# Patient Record
Sex: Female | Born: 1960 | Race: Black or African American | Hispanic: No | Smoking: Never smoker
Health system: Southern US, Community
[De-identification: ages and names within clinical notes are randomized; demographics above are authoritative.]

## PROBLEM LIST (undated history)

## (undated) DIAGNOSIS — K219 Gastro-esophageal reflux disease without esophagitis: Secondary | ICD-10-CM

## (undated) DIAGNOSIS — F32A Depression, unspecified: Secondary | ICD-10-CM

## (undated) DIAGNOSIS — F909 Attention-deficit hyperactivity disorder, unspecified type: Secondary | ICD-10-CM

## (undated) DIAGNOSIS — D649 Anemia, unspecified: Secondary | ICD-10-CM

## (undated) DIAGNOSIS — R519 Headache, unspecified: Secondary | ICD-10-CM

## (undated) DIAGNOSIS — I10 Essential (primary) hypertension: Secondary | ICD-10-CM

## (undated) HISTORY — PX: ABDOMINAL HYSTERECTOMY: SHX81

## (undated) HISTORY — DX: Attention-deficit hyperactivity disorder, unspecified type: F90.9

## (undated) HISTORY — DX: Depression, unspecified: F32.A

## (undated) HISTORY — DX: Headache, unspecified: R51.9

---

## 2004-04-05 ENCOUNTER — Other Ambulatory Visit: Payer: Self-pay

## 2006-01-29 ENCOUNTER — Emergency Department: Payer: Self-pay | Admitting: Emergency Medicine

## 2006-01-29 ENCOUNTER — Other Ambulatory Visit: Payer: Self-pay

## 2006-09-16 ENCOUNTER — Emergency Department: Payer: Self-pay | Admitting: Emergency Medicine

## 2006-09-24 ENCOUNTER — Ambulatory Visit: Payer: Self-pay

## 2006-10-27 ENCOUNTER — Other Ambulatory Visit: Payer: Self-pay

## 2006-10-27 ENCOUNTER — Ambulatory Visit: Payer: Self-pay | Admitting: Obstetrics and Gynecology

## 2006-11-23 ENCOUNTER — Ambulatory Visit: Payer: Self-pay | Admitting: Obstetrics and Gynecology

## 2007-12-16 ENCOUNTER — Ambulatory Visit: Payer: Self-pay | Admitting: Family Medicine

## 2008-02-09 ENCOUNTER — Ambulatory Visit: Payer: Self-pay | Admitting: Unknown Physician Specialty

## 2008-02-23 ENCOUNTER — Ambulatory Visit: Payer: Self-pay | Admitting: Family Medicine

## 2008-03-02 ENCOUNTER — Ambulatory Visit: Payer: Self-pay | Admitting: Unknown Physician Specialty

## 2008-10-29 ENCOUNTER — Emergency Department: Payer: Self-pay | Admitting: Emergency Medicine

## 2008-12-19 ENCOUNTER — Ambulatory Visit: Payer: Self-pay | Admitting: Internal Medicine

## 2009-05-19 ENCOUNTER — Emergency Department: Payer: Self-pay | Admitting: Emergency Medicine

## 2009-06-14 ENCOUNTER — Ambulatory Visit: Payer: Self-pay | Admitting: Surgery

## 2009-07-12 ENCOUNTER — Inpatient Hospital Stay: Payer: Self-pay | Admitting: General Surgery

## 2009-12-24 ENCOUNTER — Ambulatory Visit: Payer: Self-pay | Admitting: Internal Medicine

## 2011-01-01 ENCOUNTER — Ambulatory Visit: Payer: Self-pay | Admitting: Internal Medicine

## 2011-02-25 ENCOUNTER — Emergency Department: Payer: Self-pay | Admitting: Emergency Medicine

## 2012-01-05 ENCOUNTER — Ambulatory Visit: Payer: Self-pay | Admitting: Internal Medicine

## 2013-03-28 ENCOUNTER — Ambulatory Visit: Payer: Self-pay | Admitting: Unknown Physician Specialty

## 2013-05-06 ENCOUNTER — Ambulatory Visit: Payer: Self-pay | Admitting: Unknown Physician Specialty

## 2014-02-07 ENCOUNTER — Ambulatory Visit: Payer: Self-pay | Admitting: Internal Medicine

## 2015-01-26 ENCOUNTER — Other Ambulatory Visit: Payer: Self-pay | Admitting: Internal Medicine

## 2015-01-26 DIAGNOSIS — Z1231 Encounter for screening mammogram for malignant neoplasm of breast: Secondary | ICD-10-CM

## 2015-02-09 ENCOUNTER — Ambulatory Visit: Payer: Self-pay

## 2015-02-21 ENCOUNTER — Ambulatory Visit: Payer: Self-pay

## 2015-02-22 ENCOUNTER — Ambulatory Visit
Admission: RE | Admit: 2015-02-22 | Discharge: 2015-02-22 | Disposition: A | Discharge status: Home / Self Care | Payer: Medicare Other | Source: Ambulatory Visit | Attending: Internal Medicine | Admitting: Internal Medicine

## 2015-02-22 DIAGNOSIS — Z1231 Encounter for screening mammogram for malignant neoplasm of breast: Secondary | ICD-10-CM | POA: Diagnosis not present

## 2015-09-11 ENCOUNTER — Emergency Department: Payer: Medicare Other

## 2015-09-11 ENCOUNTER — Emergency Department
Admission: EM | Admit: 2015-09-11 | Discharge: 2015-09-11 | Disposition: A | Discharge status: Home / Self Care | Payer: Medicare Other | Attending: Emergency Medicine | Admitting: Emergency Medicine

## 2015-09-11 ENCOUNTER — Encounter: Payer: Self-pay | Admitting: *Deleted

## 2015-09-11 DIAGNOSIS — Y9289 Other specified places as the place of occurrence of the external cause: Secondary | ICD-10-CM | POA: Insufficient documentation

## 2015-09-11 DIAGNOSIS — S93402A Sprain of unspecified ligament of left ankle, initial encounter: Secondary | ICD-10-CM | POA: Insufficient documentation

## 2015-09-11 DIAGNOSIS — Y9301 Activity, walking, marching and hiking: Secondary | ICD-10-CM | POA: Insufficient documentation

## 2015-09-11 DIAGNOSIS — S99912A Unspecified injury of left ankle, initial encounter: Secondary | ICD-10-CM | POA: Diagnosis present

## 2015-09-11 DIAGNOSIS — Y998 Other external cause status: Secondary | ICD-10-CM | POA: Diagnosis not present

## 2015-09-11 DIAGNOSIS — W1843XA Slipping, tripping and stumbling without falling due to stepping from one level to another, initial encounter: Secondary | ICD-10-CM | POA: Insufficient documentation

## 2015-09-11 DIAGNOSIS — W19XXXA Unspecified fall, initial encounter: Secondary | ICD-10-CM

## 2015-09-11 DIAGNOSIS — M79605 Pain in left leg: Secondary | ICD-10-CM

## 2015-09-11 MED ORDER — NAPROXEN 500 MG PO TABS
500.0000 mg | ORAL_TABLET | Freq: Two times a day (BID) | ORAL | Status: DC
Start: 2015-09-11 — End: 2018-10-13

## 2015-09-11 NOTE — ED Provider Notes (Signed)
CSN: [id number]     Arrival date & time 09/11/15  1545 History   First MD Initiated Contact with Patient 09/11/15 1644     Chief Complaint  Patient presents with  . Ankle Pain     (Consider location/radiation/quality/duration/timing/severity/associated sxs/prior Treatment) HPI  54 year old female presents to emergency department for evaluation of left lower leg pain. Patient has 10 out of 10 pain that is sharp and located over the left distal fibula and proximal fibula. She states she was walking around 3:30 PM, stepped onto an uneven surface and rolled her left ankle. Patient is able to ambulate but with a significant limp. She's not had any medications for pain. She denies any knee or hip pain. No other injuries to her body.  History reviewed. No pertinent past medical history. History reviewed. No pertinent past surgical history. No family history on file. Social History  Substance Use Topics  . Smoking status: Never Smoker   . Smokeless tobacco: None  . Alcohol Use: No   OB History    No data available     Review of Systems  Constitutional: Negative for fever, chills, activity change and fatigue.  HENT: Negative for congestion, sinus pressure and sore throat.   Eyes: Negative for visual disturbance.  Respiratory: Negative for cough, chest tightness and shortness of breath.   Cardiovascular: Negative for chest pain and leg swelling.  Gastrointestinal: Negative for nausea, vomiting, abdominal pain and diarrhea.  Genitourinary: Negative for dysuria.  Musculoskeletal: Positive for joint swelling, arthralgias and gait problem.  Skin: Negative for rash.  Neurological: Negative for weakness, numbness and headaches.  Hematological: Negative for adenopathy.  Psychiatric/Behavioral: Negative for behavioral problems, confusion and agitation.      Allergies  Review of patient's allergies indicates no known allergies.  Home Medications   Prior to Admission medications    Medication Sig Start Date End Date Taking? Authorizing Provider  naproxen (NAPROSYN) 500 MG tablet Take 1 tablet (500 mg total) by mouth 2 (two) times daily with a meal. 09/11/15   Evon Slackhomas C Gaines, PA-C   BP 121/79 mmHg  Pulse 78  Temp(Src) 98.4 F (36.9 C) (Oral)  Resp 18  Ht 4\' 8"  (1.422 m)  Wt 74.844 kg  BMI 37.01 kg/m2  SpO2 100% Physical Exam  Constitutional: She is oriented to person, place, and time. She appears well-developed and well-nourished. No distress.  HENT:  Head: Normocephalic and atraumatic.  Mouth/Throat: Oropharynx is clear and moist.  Eyes: EOM are normal. Pupils are equal, round, and reactive to light. Right eye exhibits no discharge. Left eye exhibits no discharge.  Neck: Normal range of motion. Neck supple.  Cardiovascular: Normal rate and intact distal pulses.   Pulmonary/Chest: Effort normal. No respiratory distress.  Musculoskeletal:  Emanation of left lower extremity shows patient has full range of motion of the hip knee and ankle. There is tenderness palpation along the mid fibular shaft as well as the distal fibula. Mild lateral soft tissue swelling along the lateral ankle. There is no tenderness along the base of fifth metatarsal. She has good ankle plantarflexion dorsiflexion. She is able to bear weight but with a moderate limp. No skin breakdown noted.  Neurological: She is alert and oriented to person, place, and time. She has normal reflexes.  Skin: Skin is warm and dry.  Psychiatric: She has a normal mood and affect. Her behavior is normal. Thought content normal.    ED Course  Procedures (including critical care time) SPLINT APPLICATION Date/Time: 5:50 PM Authorized  by: Amador Cunas CHRISTOPHER Consent: Verbal consent obtained. Risks and benefits: risks, benefits and alternatives were discussed Consent given by: patient Splint applied by: ED technician Location details: Left ankle  Splint type: Prefab ankle stirrup  Supplies used: Ace  wrap ankle stirrup splint  Post-procedure: The splinted body part was neurovascularly unchanged following the procedure. Patient tolerance: Patient tolerated the procedure well with no immediate complications.       Labs Review Labs Reviewed - No data to display  Imaging Review Dg Tibia/fibula Left  09/11/2015  CLINICAL DATA:  Recent twisting injury with lower leg pain, initial encounter EXAM: LEFT TIBIA AND FIBULA - 2 VIEW COMPARISON:  None. FINDINGS: There is no evidence of fracture or other focal bone lesions. Soft tissues are unremarkable. IMPRESSION: No acute abnormality noted. Electronically Signed   By: Alcide Clever M.D.   On: 09/11/2015 17:24   I have personally reviewed and evaluated these images and lab results as part of my medical decision-making.   EKG Interpretation None      MDM   Final diagnoses:  Fall  Leg pain, lateral, left  Ankle sprain, left, initial encounter    54 year old female with left ankle sprain after fall just prior to arrival today. X-rays show no evidence of acute bony abnormality. She is placed into a ankle stirrup splint. Given crutches to help with weightbearing. She will rest ice elevate over the next few days. Naproxen 500 mg 1 tab by mouth twice a day as needed for pain and inflammation. She is weightbearing as tolerated. Follow-up with orthopedics if no improvement in 5-7 days.    Evon Slack, PA-C 09/11/15 1752  Jennye Moccasin, MD 09/11/15 [phone]

## 2015-09-11 NOTE — Discharge Instructions (Signed)
Acute Ankle Sprain With Phase I Rehab An acute ankle sprain is a partial or complete tear in one or more of the ligaments of the ankle due to traumatic injury. The severity of the injury depends on both the number of ligaments sprained and the grade of sprain. There are 3 grades of sprains.   A grade 1 sprain is a mild sprain. There is a slight pull without obvious tearing. There is no loss of strength, and the muscle and ligament are the correct length.  A grade 2 sprain is a moderate sprain. There is tearing of fibers within the substance of the ligament where it connects two bones or two cartilages. The length of the ligament is increased, and there is usually decreased strength.  A grade 3 sprain is a complete rupture of the ligament and is uncommon. In addition to the grade of sprain, there are three types of ankle sprains.  Lateral ankle sprains: This is a sprain of one or more of the three ligaments on the outer side (lateral) of the ankle. These are the most common sprains. Medial ankle sprains: There is one large triangular ligament of the inner side (medial) of the ankle that is susceptible to injury. Medial ankle sprains are less common. Syndesmosis, "high ankle," sprains: The syndesmosis is the ligament that connects the two bones of the lower leg. Syndesmosis sprains usually only occur with very severe ankle sprains. SYMPTOMS  Pain, tenderness, and swelling in the ankle, starting at the side of injury that may progress to the whole ankle and foot with time.  "Pop" or tearing sensation at the time of injury.  Bruising that may spread to the heel.  Impaired ability to walk soon after injury. CAUSES   Acute ankle sprains are caused by trauma placed on the ankle that temporarily forces or pries the anklebone (talus) out of its normal socket.  Stretching or tearing of the ligaments that normally hold the joint in place (usually due to a twisting injury). RISK INCREASES  WITH:  Previous ankle sprain.  Sports in which the foot may land awkwardly (i.e., basketball, volleyball, or soccer) or walking or running on uneven or rough surfaces.  Shoes with inadequate support to prevent sideways motion when stress occurs.  Poor strength and flexibility.  Poor balance skills.  Contact sports. PREVENTION   Warm up and stretch properly before activity.  Maintain physical fitness:  Ankle and leg flexibility, muscle strength, and endurance.  Cardiovascular fitness.  Balance training activities.  Use proper technique and have a coach correct improper technique.  Taping, protective strapping, bracing, or high-top tennis shoes may help prevent injury. Initially, tape is best; however, it loses most of its support function within 10 to 15 minutes.  Wear proper-fitted protective shoes (High-top shoes with taping or bracing is more effective than either alone).  Provide the ankle with support during sports and practice activities for 12 months following injury. PROGNOSIS   If treated properly, ankle sprains can be expected to recover completely; however, the length of recovery depends on the degree of injury.  A grade 1 sprain usually heals enough in 5 to 7 days to allow modified activity and requires an average of 6 weeks to heal completely.  A grade 2 sprain requires 6 to 10 weeks to heal completely.  A grade 3 sprain requires 12 to 16 weeks to heal.  A syndesmosis sprain often takes more than 3 months to heal. RELATED COMPLICATIONS   Frequent recurrence of symptoms may  result in a chronic problem. Appropriately addressing the problem the first time decreases the frequency of recurrence and optimizes healing time. Severity of the initial sprain does not predict the likelihood of later instability. °· Injury to other structures (bone, cartilage, or tendon). °· A chronically unstable or arthritic ankle joint is a possibility with repeated  sprains. °TREATMENT °Treatment initially involves the use of ice, medication, and compression bandages to help reduce pain and inflammation. Ankle sprains are usually immobilized in a walking cast or boot to allow for healing. Crutches may be recommended to reduce pressure on the injury. After immobilization, strengthening and stretching exercises may be necessary to regain strength and a full range of motion. Surgery is rarely needed to treat ankle sprains. °MEDICATION  °· Nonsteroidal anti-inflammatory medications, such as aspirin and ibuprofen (do not take for the first 3 days after injury or within 7 days before surgery), or other minor pain relievers, such as acetaminophen, are often recommended. Take these as directed by your caregiver. Contact your caregiver immediately if any bleeding, stomach upset, or signs of an allergic reaction occur from these medications. °· Ointments applied to the skin may be helpful. °· Pain relievers may be prescribed as necessary by your caregiver. Do not take prescription pain medication for longer than 4 to 7 days. Use only as directed and only as much as you need. °HEAT AND COLD °· Cold treatment (icing) is used to relieve pain and reduce inflammation for acute and chronic cases. Cold should be applied for 10 to 15 minutes every 2 to 3 hours for inflammation and pain and immediately after any activity that aggravates your symptoms. Use ice packs or an ice massage. °· Heat treatment may be used before performing stretching and strengthening activities prescribed by your caregiver. Use a heat pack or a warm soak. °SEEK IMMEDIATE MEDICAL CARE IF:  °· Pain, swelling, or bruising worsens despite treatment. °· You experience pain, numbness, discoloration, or coldness in the foot or toes. °· New, unexplained symptoms develop (drugs used in treatment may produce side effects.) °EXERCISES  °PHASE I EXERCISES °RANGE OF MOTION (ROM) AND STRETCHING EXERCISES - Ankle Sprain, Acute Phase I,  Weeks 1 to 2 °These exercises may help you when beginning to restore flexibility in your ankle. You will likely work on these exercises for the 1 to 2 weeks after your injury. Once your physician, physical therapist, or athletic trainer sees adequate progress, he or she will advance your exercises. While completing these exercises, remember:  °· Restoring tissue flexibility helps normal motion to return to the joints. This allows healthier, less painful movement and activity. °· An effective stretch should be held for at least 30 seconds. °· A stretch should never be painful. You should only feel a gentle lengthening or release in the stretched tissue. °RANGE OF MOTION - Dorsi/Plantar Flexion °· While sitting with your right / left knee straight, draw the top of your foot upwards by flexing your ankle. Then reverse the motion, pointing your toes downward. °· Hold each position for __________ seconds. °· After completing your first set of exercises, repeat this exercise with your knee bent. °Repeat __________ times. Complete this exercise __________ times per day.  °RANGE OF MOTION - Ankle Alphabet °· Imagine your right / left big toe is a pen. °· Keeping your hip and knee still, write out the entire alphabet with your "pen." Make the letters as large as you can without increasing any discomfort. °Repeat __________ times. Complete this exercise __________   times per day.  °STRENGTHENING EXERCISES - Ankle Sprain, Acute -Phase I, Weeks 1 to 2 °These exercises may help you when beginning to restore strength in your ankle. You will likely work on these exercises for 1 to 2 weeks after your injury. Once your physician, physical therapist, or athletic trainer sees adequate progress, he or she will advance your exercises. While completing these exercises, remember:  °· Muscles can gain both the endurance and the strength needed for everyday activities through controlled exercises. °· Complete these exercises as instructed by  your physician, physical therapist, or athletic trainer. Progress the resistance and repetitions only as guided. °· You may experience muscle soreness or fatigue, but the pain or discomfort you are trying to eliminate should never worsen during these exercises. If this pain does worsen, stop and make certain you are following the directions exactly. If the pain is still present after adjustments, discontinue the exercise until you can discuss the trouble with your clinician. °STRENGTH - Dorsiflexors °· Secure a rubber exercise band/tubing to a fixed object (i.e., table, pole) and loop the other end around your right / left foot. °· Sit on the floor facing the fixed object. The band/tubing should be slightly tense when your foot is relaxed. °· Slowly draw your foot back toward you using your ankle and toes. °· Hold this position for __________ seconds. Slowly release the tension in the band and return your foot to the starting position. °Repeat __________ times. Complete this exercise __________ times per day.  °STRENGTH - Plantar-flexors  °· Sit with your right / left leg extended. Holding onto both ends of a rubber exercise band/tubing, loop it around the ball of your foot. Keep a slight tension in the band. °· Slowly push your toes away from you, pointing them downward. °· Hold this position for __________ seconds. Return slowly, controlling the tension in the band/tubing. °Repeat __________ times. Complete this exercise __________ times per day.  °STRENGTH - Ankle Eversion °· Secure one end of a rubber exercise band/tubing to a fixed object (table, pole). Loop the other end around your foot just before your toes. °· Place your fists between your knees. This will focus your strengthening at your ankle. °· Drawing the band/tubing across your opposite foot, slowly, pull your little toe out and up. Make sure the band/tubing is positioned to resist the entire motion. °· Hold this position for __________ seconds. °Have  your muscles resist the band/tubing as it slowly pulls your foot back to the starting position.  °Repeat __________ times. Complete this exercise __________ times per day.  °STRENGTH - Ankle Inversion °· Secure one end of a rubber exercise band/tubing to a fixed object (table, pole). Loop the other end around your foot just before your toes. °· Place your fists between your knees. This will focus your strengthening at your ankle. °· Slowly, pull your big toe up and in, making sure the band/tubing is positioned to resist the entire motion. °· Hold this position for __________ seconds. °· Have your muscles resist the band/tubing as it slowly pulls your foot back to the starting position. °Repeat __________ times. Complete this exercises __________ times per day.  °STRENGTH - Towel Curls °· Sit in a chair positioned on a non-carpeted surface. °· Place your right / left foot on a towel, keeping your heel on the floor. °· Pull the towel toward your heel by only curling your toes. Keep your heel on the floor. °· If instructed by your physician, physical therapist,   or athletic trainer, add weight to the end of the towel. Repeat __________ times. Complete this exercise __________ times per day.   This information is not intended to replace advice given to you by your health care provider. Make sure you discuss any questions you have with your health care provider.   Document Released: 04/02/2005 Document Revised: 09/22/2014 Document Reviewed: 12/14/2008 [employer] Interactive Patient Education 2016 [employer]  Acute Ankle Sprain With Phase I Rehab An acute ankle sprain is a partial or complete tear in one or more of the ligaments of the ankle due to traumatic injury. The severity of the injury depends on both the number of ligaments sprained and the grade of sprain. There are 3 grades of sprains.   A grade 1 sprain is a mild sprain. There is a slight pull without obvious tearing. There is no loss of strength,  and the muscle and ligament are the correct length.  A grade 2 sprain is a moderate sprain. There is tearing of fibers within the substance of the ligament where it connects two bones or two cartilages. The length of the ligament is increased, and there is usually decreased strength.  A grade 3 sprain is a complete rupture of the ligament and is uncommon. In addition to the grade of sprain, there are three types of ankle sprains.  Lateral ankle sprains: This is a sprain of one or more of the three ligaments on the outer side (lateral) of the ankle. These are the most common sprains. Medial ankle sprains: There is one large triangular ligament of the inner side (medial) of the ankle that is susceptible to injury. Medial ankle sprains are less common. Syndesmosis, "high ankle," sprains: The syndesmosis is the ligament that connects the two bones of the lower leg. Syndesmosis sprains usually only occur with very severe ankle sprains. SYMPTOMS  Pain, tenderness, and swelling in the ankle, starting at the side of injury that may progress to the whole ankle and foot with time.  "Pop" or tearing sensation at the time of injury.  Bruising that may spread to the heel.  Impaired ability to walk soon after injury. CAUSES   Acute ankle sprains are caused by trauma placed on the ankle that temporarily forces or pries the anklebone (talus) out of its normal socket.  Stretching or tearing of the ligaments that normally hold the joint in place (usually due to a twisting injury). RISK INCREASES WITH:  Previous ankle sprain.  Sports in which the foot may land awkwardly (i.e., basketball, volleyball, or soccer) or walking or running on uneven or rough surfaces.  Shoes with inadequate support to prevent sideways motion when stress occurs.  Poor strength and flexibility.  Poor balance skills.  Contact sports. PREVENTION   Warm up and stretch properly before activity.  Maintain physical  fitness:  Ankle and leg flexibility, muscle strength, and endurance.  Cardiovascular fitness.  Balance training activities.  Use proper technique and have a coach correct improper technique.  Taping, protective strapping, bracing, or high-top tennis shoes may help prevent injury. Initially, tape is best; however, it loses most of its support function within 10 to 15 minutes.  Wear proper-fitted protective shoes (High-top shoes with taping or bracing is more effective than either alone).  Provide the ankle with support during sports and practice activities for 12 months following injury. PROGNOSIS   If treated properly, ankle sprains can be expected to recover completely; however, the length of recovery depends on the degree of injury.  A grade 1 sprain usually heals enough in 5 to 7 days to allow modified activity and requires an average of 6 weeks to heal completely.  A grade 2 sprain requires 6 to 10 weeks to heal completely.  A grade 3 sprain requires 12 to 16 weeks to heal.  A syndesmosis sprain often takes more than 3 months to heal. RELATED COMPLICATIONS   Frequent recurrence of symptoms may result in a chronic problem. Appropriately addressing the problem the first time decreases the frequency of recurrence and optimizes healing time. Severity of the initial sprain does not predict the likelihood of later instability.  Injury to other structures (bone, cartilage, or tendon).  A chronically unstable or arthritic ankle joint is a possibility with repeated sprains. TREATMENT Treatment initially involves the use of ice, medication, and compression bandages to help reduce pain and inflammation. Ankle sprains are usually immobilized in a walking cast or boot to allow for healing. Crutches may be recommended to reduce pressure on the injury. After immobilization, strengthening and stretching exercises may be necessary to regain strength and a full range of motion. Surgery is rarely  needed to treat ankle sprains. MEDICATION   Nonsteroidal anti-inflammatory medications, such as aspirin and ibuprofen (do not take for the first 3 days after injury or within 7 days before surgery), or other minor pain relievers, such as acetaminophen, are often recommended. Take these as directed by your caregiver. Contact your caregiver immediately if any bleeding, stomach upset, or signs of an allergic reaction occur from these medications.  Ointments applied to the skin may be helpful.  Pain relievers may be prescribed as necessary by your caregiver. Do not take prescription pain medication for longer than 4 to 7 days. Use only as directed and only as much as you need. HEAT AND COLD  Cold treatment (icing) is used to relieve pain and reduce inflammation for acute and chronic cases. Cold should be applied for 10 to 15 minutes every 2 to 3 hours for inflammation and pain and immediately after any activity that aggravates your symptoms. Use ice packs or an ice massage.  Heat treatment may be used before performing stretching and strengthening activities prescribed by your caregiver. Use a heat pack or a warm soak. SEEK IMMEDIATE MEDICAL CARE IF:   Pain, swelling, or bruising worsens despite treatment.  You experience pain, numbness, discoloration, or coldness in the foot or toes.  New, unexplained symptoms develop (drugs used in treatment may produce side effects.) EXERCISES  PHASE I EXERCISES RANGE OF MOTION (ROM) AND STRETCHING EXERCISES - Ankle Sprain, Acute Phase I, Weeks 1 to 2 These exercises may help you when beginning to restore flexibility in your ankle. You will likely work on these exercises for the 1 to 2 weeks after your injury. Once your physician, physical therapist, or athletic trainer sees adequate progress, he or she will advance your exercises. While completing these exercises, remember:   Restoring tissue flexibility helps normal motion to return to the joints. This  allows healthier, less painful movement and activity.  An effective stretch should be held for at least 30 seconds.  A stretch should never be painful. You should only feel a gentle lengthening or release in the stretched tissue. RANGE OF MOTION - Dorsi/Plantar Flexion  While sitting with your right / left knee straight, draw the top of your foot upwards by flexing your ankle. Then reverse the motion, pointing your toes downward.  Hold each position for __________ seconds.  After completing your first  set of exercises, repeat this exercise with your knee bent. Repeat __________ times. Complete this exercise __________ times per day.  RANGE OF MOTION - Ankle Alphabet  Imagine your right / left big toe is a pen.  Keeping your hip and knee still, write out the entire alphabet with your "pen." Make the letters as large as you can without increasing any discomfort. Repeat __________ times. Complete this exercise __________ times per day.  STRENGTHENING EXERCISES - Ankle Sprain, Acute -Phase I, Weeks 1 to 2 These exercises may help you when beginning to restore strength in your ankle. You will likely work on these exercises for 1 to 2 weeks after your injury. Once your physician, physical therapist, or athletic trainer sees adequate progress, he or she will advance your exercises. While completing these exercises, remember:   Muscles can gain both the endurance and the strength needed for everyday activities through controlled exercises.  Complete these exercises as instructed by your physician, physical therapist, or athletic trainer. Progress the resistance and repetitions only as guided.  You may experience muscle soreness or fatigue, but the pain or discomfort you are trying to eliminate should never worsen during these exercises. If this pain does worsen, stop and make certain you are following the directions exactly. If the pain is still present after adjustments, discontinue the exercise  until you can discuss the trouble with your clinician. STRENGTH - Dorsiflexors  Secure a rubber exercise band/tubing to a fixed object (i.e., table, pole) and loop the other end around your right / left foot.  Sit on the floor facing the fixed object. The band/tubing should be slightly tense when your foot is relaxed.  Slowly draw your foot back toward you using your ankle and toes.  Hold this position for __________ seconds. Slowly release the tension in the band and return your foot to the starting position. Repeat __________ times. Complete this exercise __________ times per day.  STRENGTH - Plantar-flexors   Sit with your right / left leg extended. Holding onto both ends of a rubber exercise band/tubing, loop it around the ball of your foot. Keep a slight tension in the band.  Slowly push your toes away from you, pointing them downward.  Hold this position for __________ seconds. Return slowly, controlling the tension in the band/tubing. Repeat __________ times. Complete this exercise __________ times per day.  STRENGTH - Ankle Eversion  Secure one end of a rubber exercise band/tubing to a fixed object (table, pole). Loop the other end around your foot just before your toes.  Place your fists between your knees. This will focus your strengthening at your ankle.  Drawing the band/tubing across your opposite foot, slowly, pull your little toe out and up. Make sure the band/tubing is positioned to resist the entire motion.  Hold this position for __________ seconds. Have your muscles resist the band/tubing as it slowly pulls your foot back to the starting position.  Repeat __________ times. Complete this exercise __________ times per day.  STRENGTH - Ankle Inversion  Secure one end of a rubber exercise band/tubing to a fixed object (table, pole). Loop the other end around your foot just before your toes.  Place your fists between your knees. This will focus your strengthening at  your ankle.  Slowly, pull your big toe up and in, making sure the band/tubing is positioned to resist the entire motion.  Hold this position for __________ seconds.  Have your muscles resist the band/tubing as it slowly pulls your foot  back to the starting position. Repeat __________ times. Complete this exercises __________ times per day.  STRENGTH - Towel Curls  Sit in a chair positioned on a non-carpeted surface.  Place your right / left foot on a towel, keeping your heel on the floor.  Pull the towel toward your heel by only curling your toes. Keep your heel on the floor.  If instructed by your physician, physical therapist, or athletic trainer, add weight to the end of the towel. Repeat __________ times. Complete this exercise __________ times per day.   This information is not intended to replace advice given to you by your health care provider. Make sure you discuss any questions you have with your health care provider.   Document Released: 04/02/2005 Document Revised: 09/22/2014 Document Reviewed: 12/14/2008 [employer] Interactive Patient Education 2016 [employer]  Cryotherapy Cryotherapy means treatment with cold. Ice or gel packs can be used to reduce both pain and swelling. Ice is the most helpful within the first 24 to 48 hours after an injury or flare-up from overusing a muscle or joint. Sprains, strains, spasms, burning pain, shooting pain, and aches can all be eased with ice. Ice can also be used when recovering from surgery. Ice is effective, has very few side effects, and is safe for most people to use. PRECAUTIONS  Ice is not a safe treatment option for people with:  Raynaud phenomenon. This is a condition affecting small blood vessels in the extremities. Exposure to cold may cause your problems to return.  Cold hypersensitivity. There are many forms of cold hypersensitivity, including:  Cold urticaria. Red, itchy hives appear on the skin when the tissues begin to  warm after being iced.  Cold erythema. This is a red, itchy rash caused by exposure to cold.  Cold hemoglobinuria. Red blood cells break down when the tissues begin to warm after being iced. The hemoglobin that carry oxygen are passed into the urine because they cannot combine with blood proteins fast enough.  Numbness or altered sensitivity in the area being iced. If you have any of the following conditions, do not use ice until you have discussed cryotherapy with your caregiver:  Heart conditions, such as arrhythmia, angina, or chronic heart disease.  High blood pressure.  Healing wounds or open skin in the area being iced.  Current infections.  Rheumatoid arthritis.  Poor circulation.  Diabetes. Ice slows the blood flow in the region it is applied. This is beneficial when trying to stop inflamed tissues from spreading irritating chemicals to surrounding tissues. However, if you expose your skin to cold temperatures for too long or without the proper protection, you can damage your skin or nerves. Watch for signs of skin damage due to cold. HOME CARE INSTRUCTIONS Follow these tips to use ice and cold packs safely.  Place a dry or damp towel between the ice and skin. A damp towel will cool the skin more quickly, so you may need to shorten the time that the ice is used.  For a more rapid response, add gentle compression to the ice.  Ice for no more than 10 to 20 minutes at a time. The bonier the area you are icing, the less time it will take to get the benefits of ice.  Check your skin after 5 minutes to make sure there are no signs of a poor response to cold or skin damage.  Rest 20 minutes or more between uses.  Once your skin is numb, you can end your  treatment. You can test numbness by very lightly touching your skin. The touch should be so light that you do not see the skin dimple from the pressure of your fingertip. When using ice, most people will feel these normal  sensations in this order: cold, burning, aching, and numbness.  Do not use ice on someone who cannot communicate their responses to pain, such as small children or people with dementia. HOW TO MAKE AN ICE PACK Ice packs are the most common way to use ice therapy. Other methods include ice massage, ice baths, and cryosprays. Muscle creams that cause a cold, tingly feeling do not offer the same benefits that ice offers and should not be used as a substitute unless recommended by your caregiver. To make an ice pack, do one of the following:  Place crushed ice or a bag of frozen vegetables in a sealable plastic bag. Squeeze out the excess air. Place this bag inside another plastic bag. Slide the bag into a pillowcase or place a damp towel between your skin and the bag.  Mix 3 parts water with 1 part rubbing alcohol. Freeze the mixture in a sealable plastic bag. When you remove the mixture from the freezer, it will be slushy. Squeeze out the excess air. Place this bag inside another plastic bag. Slide the bag into a pillowcase or place a damp towel between your skin and the bag. SEEK MEDICAL CARE IF:  You develop white spots on your skin. This may give the skin a blotchy (mottled) appearance.  Your skin turns blue or pale.  Your skin becomes waxy or hard.  Your swelling gets worse. MAKE SURE YOU:   Understand these instructions.  Will watch your condition.  Will get help right away if you are not doing well or get worse.   This information is not intended to replace advice given to you by your health care provider. Make sure you discuss any questions you have with your health care provider.   Document Released: 04/28/2011 Document Revised: 09/22/2014 Document Reviewed: 04/28/2011 Elsevier Interactive Patient Education [employer]

## 2015-09-11 NOTE — ED Notes (Signed)
Patient states she believes she sprained her left ankle when stepping off a step. Patient states she heard a pop.

## 2015-10-14 ENCOUNTER — Emergency Department
Admission: EM | Admit: 2015-10-14 | Discharge: 2015-10-14 | Disposition: A | Discharge status: Home / Self Care | Payer: Medicare Other | Attending: Emergency Medicine | Admitting: Emergency Medicine

## 2015-10-14 ENCOUNTER — Encounter: Payer: Self-pay | Admitting: Emergency Medicine

## 2015-10-14 DIAGNOSIS — R112 Nausea with vomiting, unspecified: Secondary | ICD-10-CM | POA: Insufficient documentation

## 2015-10-14 DIAGNOSIS — R1012 Left upper quadrant pain: Secondary | ICD-10-CM | POA: Diagnosis not present

## 2015-10-14 DIAGNOSIS — R197 Diarrhea, unspecified: Secondary | ICD-10-CM | POA: Diagnosis not present

## 2015-10-14 DIAGNOSIS — Z791 Long term (current) use of non-steroidal anti-inflammatories (NSAID): Secondary | ICD-10-CM | POA: Diagnosis not present

## 2015-10-14 LAB — COMPREHENSIVE METABOLIC PANEL
ALK PHOS: 67 U/L (ref 38–126)
ALT: 43 U/L (ref 14–54)
ANION GAP: 4 — AB (ref 5–15)
AST: 37 U/L (ref 15–41)
Albumin: 3.5 g/dL (ref 3.5–5.0)
BILIRUBIN TOTAL: 0.4 mg/dL (ref 0.3–1.2)
BUN: 10 mg/dL (ref 6–20)
CALCIUM: 8.5 mg/dL — AB (ref 8.9–10.3)
CO2: 29 mmol/L (ref 22–32)
Chloride: 105 mmol/L (ref 101–111)
Creatinine, Ser: 0.95 mg/dL (ref 0.44–1.00)
Glucose, Bld: 93 mg/dL (ref 65–99)
Potassium: 3.5 mmol/L (ref 3.5–5.1)
SODIUM: 138 mmol/L (ref 135–145)
TOTAL PROTEIN: 7.3 g/dL (ref 6.5–8.1)

## 2015-10-14 LAB — CBC WITH DIFFERENTIAL/PLATELET
BASOS PCT: 3 %
Basophils Absolute: 0.2 10*3/uL — ABNORMAL HIGH (ref 0–0.1)
EOS ABS: 0 10*3/uL (ref 0–0.7)
EOS PCT: 0 %
HCT: 39.1 % (ref 35.0–47.0)
Hemoglobin: 13.1 g/dL (ref 12.0–16.0)
Lymphocytes Relative: 16 %
Lymphs Abs: 1.2 10*3/uL (ref 1.0–3.6)
MCH: 30.5 pg (ref 26.0–34.0)
MCHC: 33.6 g/dL (ref 32.0–36.0)
MCV: 90.8 fL (ref 80.0–100.0)
MONO ABS: 0.7 10*3/uL (ref 0.2–0.9)
MONOS PCT: 9 %
Neutro Abs: 5.6 10*3/uL (ref 1.4–6.5)
Neutrophils Relative %: 72 %
Platelets: 214 10*3/uL (ref 150–440)
RBC: 4.3 MIL/uL (ref 3.80–5.20)
RDW: 14.3 % (ref 11.5–14.5)
WBC: 7.7 10*3/uL (ref 3.6–11.0)

## 2015-10-14 LAB — LIPASE, BLOOD: LIPASE: 28 U/L (ref 11–51)

## 2015-10-14 MED ORDER — ONDANSETRON HCL 4 MG/2ML IJ SOLN: 4.0000 mg | Freq: Once | INTRAMUSCULAR | Status: DC

## 2015-10-14 MED ORDER — SODIUM CHLORIDE 0.9 % IV BOLUS (SEPSIS)
1000.0000 mL | Freq: Once | INTRAVENOUS | Status: AC
  Administered 2015-10-14: 1000 mL via INTRAVENOUS

## 2015-10-14 MED ORDER — FAMOTIDINE 40 MG PO TABS: 40.0000 mg | ORAL_TABLET | Freq: Every evening | ORAL | Status: DC

## 2015-10-14 MED ORDER — ONDANSETRON HCL 4 MG/2ML IJ SOLN
INTRAMUSCULAR | Status: AC
  Administered 2015-10-14: 08:00:00
  Filled 2015-10-14: qty 2

## 2015-10-14 MED ORDER — METOCLOPRAMIDE HCL 10 MG PO TABS: 10.0000 mg | ORAL_TABLET | Freq: Four times a day (QID) | ORAL | Status: DC | PRN

## 2015-10-14 MED ORDER — FAMOTIDINE IN NACL 20-0.9 MG/50ML-% IV SOLN
20.0000 mg | Freq: Once | INTRAVENOUS | Status: AC
  Administered 2015-10-14: 20 mg via INTRAVENOUS
  Filled 2015-10-14: qty 50

## 2015-10-14 MED ORDER — GI COCKTAIL ~~LOC~~
30.0000 mL | Freq: Once | ORAL | Status: AC
  Administered 2015-10-14: 30 mL via ORAL
  Filled 2015-10-14: qty 30

## 2015-10-14 NOTE — ED Notes (Signed)
Patient states that she has been vomiting, diarrhea and chills since Friday evening.

## 2015-10-14 NOTE — ED Provider Notes (Signed)
[facility] Emergency Department Provider Note  ____________________________________________  Time seen: Approximately 7:15 AM  I have reviewed the triage vital signs and the nursing notes.   HISTORY  Chief Complaint Emesis; Diarrhea; and Fever    HPI Lacey Ramirez is a 55 y.o. female with a history of depression who is presenting today with nausea vomiting and diarrhea and abdominal pain since this past Friday. She says that she was vomiting multiple times this past Friday and several times yesterday but the vomiting has stopped as of late yesterday and early this morning. However, still with persistent diarrhea with 3 episodes this morning. Denies any recent hospitalizations, or antibiotics. Says that the abdominal pain is cramping and to the upper abdomen. She denies any smoking drinking or drugs. No known sick contacts.   History reviewed. No pertinent past medical history.  There are no active problems to display for this patient.   No past surgical history on file.  Current Outpatient Rx  Name  Route  Sig  Dispense  Refill  . naproxen (NAPROSYN) 500 MG tablet   Oral   Take 1 tablet (500 mg total) by mouth 2 (two) times daily with a meal.   30 tablet   0     Allergies Review of patient's allergies indicates no known allergies.  No family history on file.  Social History Social History  Substance Use Topics  . Smoking status: Never Smoker   . Smokeless tobacco: None  . Alcohol Use: No    Review of Systems Constitutional: No fever/chills Eyes: No visual changes. ENT: No sore throat. Cardiovascular: Denies chest pain. Respiratory: Denies shortness of breath. Gastrointestinal:no constipation. Genitourinary: Negative for dysuria. Musculoskeletal: Negative for back pain. Skin: Negative for rash. Neurological: Negative for headaches, focal weakness or numbness.  10-point ROS otherwise  negative.  ____________________________________________   PHYSICAL EXAM:  VITAL SIGNS: ED Triage Vitals  Enc Vitals Group     BP 10/14/15 0700 126/74 mmHg     Pulse Rate 10/14/15 0700 85     Resp 10/14/15 0700 18     Temp 10/14/15 0700 98.3 F (36.8 C)     Temp Source 10/14/15 0700 Oral     SpO2 10/14/15 0700 99 %     Weight 10/14/15 0700 165 lb (74.844 kg)     Height 10/14/15 0700  (1.422 m)     Head Cir --      Peak Flow --      Pain Score 10/14/15 0701 10     Pain Loc --      Pain Edu? --      Excl. in GC? --     Constitutional: Alert and oriented. Well appearing and in no acute distress. Eyes: Conjunctivae are normal. PERRL. EOMI. Head: Atraumatic. Nose: No congestion/rhinnorhea. Mouth/Throat: Mucous membranes are moist.  Oropharynx non-erythematous. Neck: No stridor.   Cardiovascular: Normal rate, regular rhythm. Grossly normal heart sounds.  Good peripheral circulation. Respiratory: Normal respiratory effort.  No retractions. Lungs CTAB. Gastrointestinal: Soft with tenderness to the left upper quadrant. There is a negative Murphy sign. There is no tenderness over McBurney's point. No distention. No CVA tenderness. Musculoskeletal: No lower extremity tenderness nor edema.  No joint effusions. Neurologic:  Normal speech and language. No gross focal neurologic deficits are appreciated. No gait instability. Skin:  Skin is warm, dry and intact. No rash noted. Psychiatric: Mood and affect are normal. Speech and behavior are normal.  ____________________________________________   LABS (all labs  ordered are listed, but only abnormal results are displayed)  Labs Reviewed  CBC WITH DIFFERENTIAL/PLATELET - Abnormal; Notable for the following:    Basophils Absolute 0.2 (*)    All other components within normal limits  COMPREHENSIVE METABOLIC PANEL - Abnormal; Notable for the following:    Calcium 8.5 (*)    Anion gap 4 (*)    All other components within normal  limits  LIPASE, BLOOD   ____________________________________________  EKG   ____________________________________________  RADIOLOGY   ____________________________________________   PROCEDURES    ____________________________________________   INITIAL IMPRESSION / ASSESSMENT AND PLAN / ED COURSE  Pertinent labs & imaging results that were available during my care of the patient were reviewed by me and considered in my medical decision making (see chart for details).  ----------------------------------------- 8:15 AM on 10/14/2015 -----------------------------------------  Patient with very reassuring lab results. Tolerated by mouth fluids(gi cocktail).  Pain is improved after GI cocktail. No C. difficile risk factors. Feel that this is likely a viral illness. Patient said that she also had some runny nose and cough as well. Will be discharged home with Reglan for nausea. Patient to [facility]. Abdominal pain likely gastritis. ____________________________________________   FINAL CLINICAL IMPRESSION(S) / ED DIAGNOSES  Nausea vomiting diarrhea with abdominal pain.    Myrna Blazer, MD 10/14/15 [phone]

## 2015-10-14 NOTE — Discharge Instructions (Signed)

## 2016-03-11 ENCOUNTER — Other Ambulatory Visit: Payer: Self-pay | Admitting: Internal Medicine

## 2017-05-23 ENCOUNTER — Emergency Department
Admission: EM | Admit: 2017-05-23 | Discharge: 2017-05-23 | Disposition: A | Discharge status: Home / Self Care | Payer: Medicare Other | Attending: Emergency Medicine | Admitting: Emergency Medicine

## 2017-05-23 ENCOUNTER — Emergency Department: Payer: Medicare Other

## 2017-05-23 ENCOUNTER — Other Ambulatory Visit: Payer: Self-pay

## 2017-05-23 DIAGNOSIS — Z79899 Other long term (current) drug therapy: Secondary | ICD-10-CM | POA: Diagnosis not present

## 2017-05-23 DIAGNOSIS — R079 Chest pain, unspecified: Secondary | ICD-10-CM

## 2017-05-23 DIAGNOSIS — M62838 Other muscle spasm: Secondary | ICD-10-CM | POA: Insufficient documentation

## 2017-05-23 LAB — BASIC METABOLIC PANEL
Anion gap: 7 (ref 5–15)
BUN: 12 mg/dL (ref 6–20)
CALCIUM: 9.3 mg/dL (ref 8.9–10.3)
CO2: 28 mmol/L (ref 22–32)
CREATININE: 1 mg/dL (ref 0.44–1.00)
Chloride: 105 mmol/L (ref 101–111)
GFR calc Af Amer: >60 mL/min (ref >60)
GFR calc non Af Amer: >60 mL/min (ref >60)
GLUCOSE: 110 mg/dL — AB (ref 65–99)
Potassium: 3.4 mmol/L — ABNORMAL LOW (ref 3.5–5.1)
Sodium: 140 mmol/L (ref 135–145)

## 2017-05-23 LAB — CBC
HCT: 39.3 % (ref 35.0–47.0)
Hemoglobin: 13.1 g/dL (ref 12.0–16.0)
MCH: 30.7 pg (ref 26.0–34.0)
MCHC: 33.3 g/dL (ref 32.0–36.0)
MCV: 92 fL (ref 80.0–100.0)
PLATELETS: 266 10*3/uL (ref 150–440)
RBC: 4.27 MIL/uL (ref 3.80–5.20)
RDW: 14.2 % (ref 11.5–14.5)
WBC: 12.4 10*3/uL — ABNORMAL HIGH (ref 3.6–11.0)

## 2017-05-23 LAB — TROPONIN I

## 2017-05-23 MED ORDER — IBUPROFEN 400 MG PO TABS
400.0000 mg | ORAL_TABLET | Freq: Once | ORAL | Status: AC
  Administered 2017-05-23: 400 mg via ORAL
  Filled 2017-05-23: qty 1

## 2017-05-23 MED ORDER — OXYCODONE-ACETAMINOPHEN 5-325 MG PO TABS
1.0000 | ORAL_TABLET | Freq: Once | ORAL | Status: AC
  Administered 2017-05-23: 1 via ORAL
  Filled 2017-05-23: qty 1

## 2017-05-23 MED ORDER — CYCLOBENZAPRINE HCL 10 MG PO TABS: 10.0000 mg | ORAL_TABLET | Freq: Three times a day (TID) | ORAL | 0 refills | Status: AC | PRN

## 2017-05-23 MED ORDER — CYCLOBENZAPRINE HCL 10 MG PO TABS
10.0000 mg | ORAL_TABLET | Freq: Once | ORAL | Status: AC
Start: 2017-05-23 — End: 2017-05-23
  Administered 2017-05-23: 10 mg via ORAL
  Filled 2017-05-23: qty 1

## 2017-05-23 NOTE — Discharge Instructions (Signed)
you were evaluated for left-sided neck pain and chest discomfort. Your exam and evaluation are overall reassuring, and as we discussed I am most suspicious of trapezius muscle spasm causing your symptoms. However given some of your risk factors for heart disease, I am recommending a follow up with a cardiologist. For muscle spasm relief, you may try over-the-counter ibuprofen 400 mg every 8 hours as needed for anti-inflammation and mild pain relief. You're being prescribed muscle relaxer. You may consider following up with a primary care doctor for physical therapy/massage referral.

## 2017-05-23 NOTE — ED Provider Notes (Signed)
[facility] Emergency Department Provider Note ____________________________________________   I have reviewed the triage vital signs and the triage nursing note.  HISTORY  Chief Complaint Chest Pain   Historian Patient  HPI Lacey Ramirez is a 56 y.o. female Presenting with 2 days of left shoulder and neck discomfort which extends down into the left anterior chest. No traumatic injury or overuse. She does have tenderness patient when she moves her shoulder and palpates across her trapezius area. Denies headache. Denies weakness or numbness. Denies trouble breathing. Denies pleuritic chest pain or trouble breathing or shortness of breath or coughing.      History reviewed. No pertinent past medical history.  There are no active problems to display for this patient.   History reviewed. No pertinent surgical history.  Prior to Admission medications   Medication Sig Start Date End Date Taking? Authorizing Provider  cyclobenzaprine (FLEXERIL) 10 MG tablet Take 1 tablet (10 mg total) by mouth 3 (three) times daily as needed for muscle spasms. 05/23/17   Governor RooksLord, Eustace Hur, MD  famotidine (PEPCID) 40 MG tablet Take 1 tablet (40 mg total) by mouth every evening. 10/14/15 10/13/16  Schaevitz, Myra Rudeavid Matthew, MD  metoCLOPramide (REGLAN) 10 MG tablet Take 1 tablet (10 mg total) by mouth every 6 (six) hours as needed for nausea or vomiting. 10/14/15   Pershing ProudSchaevitz, Myra Rudeavid Matthew, MD  naproxen (NAPROSYN) 500 MG tablet Take 1 tablet (500 mg total) by mouth 2 (two) times daily with a meal. 09/11/15   Evon SlackGaines, Thomas C, PA-C    No Known Allergies  No family history on file.  Social History Social History  Substance Use Topics  . Smoking status: Never Smoker  . Smokeless tobacco: Not on file  . Alcohol use No    Review of Systems  Constitutional: Negative for fever. Eyes: Negative for visual changes. ENT: Negative for sore throat. Cardiovascular: Chest discomfort to the  left anterior chest as per history of present illness. Respiratory: Negative for shortness of breath. Gastrointestinal: Negative for abdominal pain, vomiting and diarrhea. Genitourinary: Negative for dysuria. Musculoskeletal: Negative for back pain.  neck discomfort along the left lateral neck. Skin: Negative for rash. Neurological: Negative for headache.  ____________________________________________   PHYSICAL EXAM:  VITAL SIGNS: ED Triage Vitals  Enc Vitals Group     BP 05/23/17 1741 (!) 171/84     Pulse Rate 05/23/17 1741 84     Resp 05/23/17 1741 16     Temp 05/23/17 1741 98.2 F (36.8 C)     Temp Source 05/23/17 1741 Oral     SpO2 05/23/17 1741 100 %     Weight 05/23/17 1742 165 lb (74.8 kg)     Height 05/23/17 1742 4\' 8"  (1.422 m)     Head Circumference --      Peak Flow --      Pain Score --      Pain Loc --      Pain Edu? --      Excl. in GC? --      Constitutional: Alert and oriented. Well appearing and in no distress. HEENT   Head: Normocephalic and atraumatic.      Eyes: Conjunctivae are normal. Pupils equal and round.       Ears:         Nose: No congestion/rhinnorhea.   Mouth/Throat: Mucous membranes are moist.   Neck: No stridor.  mild tenderness to the muscle attachment points at the occipital and muscles of the left lateral  neck and tenderness along palpable muscle spasm of the trapezius on the left. Cardiovascular/Chest: Normal rate, regular rhythm.  No murmurs, rubs, or gallops. Respiratory: Normal respiratory effort without tachypnea nor retractions. Breath sounds are clear and equal bilaterally. No wheezes/rales/rhonchi. Gastrointestinal: Soft. No distention, no guarding, no rebound. Nontender.    Genitourinary/rectal:Deferred Musculoskeletal:  very tense and tender trapezius muscle spasm on the left.  No joint effusions.  No lower extremity tenderness.  No edema. Neurologic:  Normal speech and language. No gross or focal neurologic deficits  are appreciated. Skin:  Skin is warm, dry and intact. No rash noted. Psychiatric: Mood and affect are normal. Speech and behavior are normal. Patient exhibits appropriate insight and judgment.   ____________________________________________  LABS (pertinent positives/negatives)  Labs Reviewed  BASIC METABOLIC PANEL - Abnormal; Notable for the following:       Result Value   Potassium 3.4 (*)    Glucose, Bld 110 (*)    All other components within normal limits  CBC - Abnormal; Notable for the following:    WBC 12.4 (*)    All other components within normal limits  TROPONIN I    ____________________________________________    EKG I, Governor Rooks, MD, the attending physician have personally viewed and interpreted all ECGs.  71 bpm. Normal sinus rhythm. Narrow QRS. Normal axis. Normal ST and T-wave ____________________________________________  RADIOLOGY All Xrays were viewed by me. Imaging interpreted by Radiologist.  none __________________________________________  PROCEDURES  Procedure(s) performed: None  Critical Care performed: None  ____________________________________________   ED COURSE / ASSESSMENT AND PLAN  Pertinent labs & imaging results that were available during my care of the patient were reviewed by me and considered in my medical decision making (see chart for details).    patient has a tender palpable muscle spasm which I think is the source of her left-sided shoulder and neck discomfort which extends slightly into the left anterior shoulder. No other symptoms currently related to cardiac. EKG is reassuring. His troponin is negative and symptoms have been ongoing for 2 days now.  Given history of hypertension hyperlipidemia, I have recommended that she follow up with cardiologist as well as her primary care doctor.    CONSULTATIONS:  None   Patient / Family / Caregiver informed of clinical course, medical decision-making process, and agree  with plan.   I discussed return precautions, follow-up instructions, and discharge instructions with patient and/or family.  Discharge Instructions : you were evaluated for left-sided neck pain and chest discomfort. Your exam and evaluation are overall reassuring, and as we discussed I am most suspicious of trapezius muscle spasm causing your symptoms. However given some of your risk factors for heart disease, I am recommending a follow up with a cardiologist. For muscle spasm relief, you may try over-the-counter ibuprofen 400 mg every 8 hours as needed for anti-inflammation and mild pain relief. You're being prescribed muscle relaxer. You may consider following up with a primary care doctor for physical therapy/massage referral.  ___________________________________________   FINAL CLINICAL IMPRESSION(S) / ED DIAGNOSES   Final diagnoses:  Trapezius muscle spasm  Nonspecific chest pain              Note: This dictation was prepared with Dragon dictation. Any transcriptional errors that result from this process are unintentional    Governor Rooks, MD 05/23/17 2017

## 2017-05-23 NOTE — ED Triage Notes (Signed)
Pt came to ED via pov c/o chest pain, left sided with radiation to neck starting yesterday. Denies sob. BP 171/84, history of htn.

## 2017-08-05 ENCOUNTER — Other Ambulatory Visit: Payer: Self-pay | Admitting: Primary Care

## 2017-08-05 DIAGNOSIS — Z1231 Encounter for screening mammogram for malignant neoplasm of breast: Secondary | ICD-10-CM

## 2017-09-04 ENCOUNTER — Ambulatory Visit
Admission: RE | Admit: 2017-09-04 | Discharge: 2017-09-04 | Disposition: A | Discharge status: Home / Self Care | Payer: Medicare Other | Source: Ambulatory Visit | Attending: Primary Care | Admitting: Primary Care

## 2017-09-04 DIAGNOSIS — Z1231 Encounter for screening mammogram for malignant neoplasm of breast: Secondary | ICD-10-CM | POA: Insufficient documentation

## 2018-08-25 ENCOUNTER — Encounter: Payer: Self-pay | Admitting: *Deleted

## 2018-08-25 ENCOUNTER — Encounter: Payer: Self-pay | Admitting: Internal Medicine

## 2018-10-12 ENCOUNTER — Encounter: Payer: Self-pay | Admitting: Gastroenterology

## 2018-10-12 ENCOUNTER — Other Ambulatory Visit: Payer: Self-pay

## 2018-10-12 ENCOUNTER — Ambulatory Visit (INDEPENDENT_AMBULATORY_CARE_PROVIDER_SITE_OTHER): Payer: Medicare HMO | Admitting: Gastroenterology

## 2018-10-12 VITALS — BP 142/84 | HR 69 | Resp 17 | Wt 163.4 lb

## 2018-10-12 DIAGNOSIS — Z860100 Personal history of colon polyps, unspecified: Secondary | ICD-10-CM

## 2018-10-12 DIAGNOSIS — R1013 Epigastric pain: Secondary | ICD-10-CM | POA: Diagnosis not present

## 2018-10-12 DIAGNOSIS — R12 Heartburn: Secondary | ICD-10-CM

## 2018-10-12 MED ORDER — OMEPRAZOLE 40 MG PO CPDR: 40.0000 mg | DELAYED_RELEASE_CAPSULE | Freq: Two times a day (BID) | ORAL | 0 refills | Status: AC

## 2018-10-12 NOTE — Progress Notes (Signed)
Arlyss Repress, MD [address]  [address]  [place], [place] [address]  Main: [phone]  Fax: [phone]    Gastroenterology Consultation  Referring Provider:     Hyman Hopes, MD Primary Care Physician:  Hyman Hopes, MD Primary Gastroenterologist:  Dr. Arlyss Repress Reason for Consultation:     Heart burn        HPI:   Lacey Ramirez is a 58 y.o. female referred by Dr. Lawerance Bach, Shellia Cleverly, MD  for consultation & management of heartburn.  She has been experiencing burning in her center of the chest as well as in her stomach for the last 4 to 6 months.  She has been started on omeprazole 20 mg daily as well as sucralfate which provide partial relief.  She does have nocturnal heartburn that wakes her up from sleep.  She takes omeprazole in the morning.  She also noticed that her stools were black in the past.  She is trying to eat healthy and lost few pounds.  She denies difficulty swallowing.  Currently, her stools are brown.  Patient denies abdominal pain, loss of appetite.  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  - NEGATIVE FOR H.PYLORI, DYSPLASIA AND MALIGNANCY. Pancreatic cancer in her 2 brothers under 29 years of age  No past medical history on file.  No past surgical history on file.  Current Outpatient Medications:  .  Cetirizine HCl 10 MG CAPS, Take 10 mg by mouth daily., Disp: , Rfl:  .  cyclobenzaprine (FLEXERIL) 10 MG tablet, Take 1 tablet (10 mg total) by mouth 3 (three) times daily as needed for muscle spasms., Disp: 21 tablet, Rfl: 0 .  FLUoxetine (PROZAC) 40 MG capsule, Take 40 mg by mouth 2 (two) times daily., Disp: , Rfl:  .  hydrochlorothiazide (MICROZIDE) 12.5 MG capsule, Take 12.5 mg by mouth daily., Disp: ,  Rfl:  .  hydrOXYzine (ATARAX/VISTARIL) 25 MG tablet, Take 25 mg by mouth every 6 (six) hours as needed., Disp: , Rfl:  .  sucralfate (CARAFATE) 1 g tablet, Take 1 g by mouth 4 (four) times daily., Disp: , Rfl:  .  famotidine (PEPCID) 40 MG tablet, Take 1 tablet (40 mg total) by mouth every evening., Disp: 15 tablet, Rfl: 0 .  omeprazole (PRILOSEC) 40 MG capsule, Take 1 capsule (40 mg total) by mouth 2 (two) times daily before a meal for 30 days., Disp: 60 capsule, Rfl: 0   Family History  Problem Relation Age of Onset  . Breast cancer Paternal Aunt   . Breast cancer Maternal Grandmother   . Breast cancer Paternal Grandmother      Social History   Tobacco Use  . Smoking status: Never Smoker  . Smokeless tobacco: Never Used  Substance Use Topics  . Alcohol use: Yes    Comment: occasional  . Drug use: No    Allergies as of 10/12/2018  . (No Known Allergies)    Review of Systems:    All systems reviewed and negative except where noted in HPI.   Physical Exam:  BP (!) 142/84 (BP Location: Left Arm, Patient Position: Sitting, Cuff Size: Large)   Pulse 69   Resp 17   Wt 163 lb 6.4 oz (74.1 kg)   BMI 36.63 kg/m  No  LMP recorded. Patient has had a hysterectomy.  General:   Alert,  Well-developed, well-nourished, pleasant and cooperative in NAD Head:  Normocephalic and atraumatic. Eyes:  Sclera clear, no icterus.   Conjunctiva pink. Ears:  Normal auditory acuity. Nose:  No deformity, discharge, or lesions. Mouth:  No deformity or lesions,oropharynx pink & moist. Neck:  Supple; no masses or thyromegaly. Lungs:  Respirations even and unlabored.  Clear throughout to auscultation.   No wheezes, crackles, or rhonchi. No acute distress. Heart:  Regular rate and rhythm; no murmurs, clicks, rubs, or gallops. Abdomen:  Normal bowel sounds. Soft, non-tender and non-distended without masses, hepatosplenomegaly or hernias noted.  No guarding or rebound tenderness.   Rectal: Not  performed Msk:  Symmetrical without gross deformities. Good, equal movement & strength bilaterally. Pulses:  Normal pulses noted. Extremities:  No clubbing or edema.  No cyanosis. Neurologic:  Alert and oriented x3;  grossly normal neurologically. Skin:  Intact without significant lesions or rashes. No jaundice. Psych:  Alert and cooperative. Normal mood and affect.  Imaging Studies: No recent imaging in last 5 years  Assessment and Plan:   Lacey Ramirez is a 58 y.o. African-American female with morbid obesity with chronic heartburn and epigastric burning pain, on low-dose PPI  Heartburn and dyspepsia Increase omeprazole to 40 mg twice daily Discussed with her about antireflux lifestyle methods Strongly advised her to lose weight Keep head of the bed elevated EGD for further evaluation  Personal history of colon polyps Schedule surveillance colonoscopy   Follow up in 2 months   Arlyss Repress, MD

## 2018-10-26 ENCOUNTER — Other Ambulatory Visit: Payer: Self-pay | Admitting: Internal Medicine

## 2018-10-26 DIAGNOSIS — Z1231 Encounter for screening mammogram for malignant neoplasm of breast: Secondary | ICD-10-CM

## 2018-11-01 ENCOUNTER — Ambulatory Visit
Admission: RE | Admit: 2018-11-01 | Discharge: 2018-11-01 | Disposition: A | Discharge status: Home / Self Care | Payer: Medicare HMO | Attending: Gastroenterology | Admitting: Gastroenterology

## 2018-11-01 ENCOUNTER — Ambulatory Visit: Payer: Medicare HMO | Admitting: Anesthesiology

## 2018-11-01 ENCOUNTER — Other Ambulatory Visit: Payer: Self-pay

## 2018-11-01 ENCOUNTER — Encounter
Admission: RE | Disposition: A | Discharge status: Home / Self Care | Payer: Self-pay | Source: Home / Self Care | Attending: Gastroenterology

## 2018-11-01 DIAGNOSIS — Z860100 Personal history of colon polyps, unspecified: Secondary | ICD-10-CM

## 2018-11-01 DIAGNOSIS — R12 Heartburn: Secondary | ICD-10-CM | POA: Diagnosis not present

## 2018-11-01 DIAGNOSIS — K219 Gastro-esophageal reflux disease without esophagitis: Secondary | ICD-10-CM | POA: Insufficient documentation

## 2018-11-01 DIAGNOSIS — K5730 Diverticulosis of large intestine without perforation or abscess without bleeding: Secondary | ICD-10-CM | POA: Diagnosis not present

## 2018-11-01 DIAGNOSIS — Z1211 Encounter for screening for malignant neoplasm of colon: Secondary | ICD-10-CM | POA: Insufficient documentation

## 2018-11-01 DIAGNOSIS — I10 Essential (primary) hypertension: Secondary | ICD-10-CM | POA: Diagnosis not present

## 2018-11-01 DIAGNOSIS — R1013 Epigastric pain: Secondary | ICD-10-CM

## 2018-11-01 DIAGNOSIS — Z79899 Other long term (current) drug therapy: Secondary | ICD-10-CM | POA: Insufficient documentation

## 2018-11-01 HISTORY — DX: Essential (primary) hypertension: I10

## 2018-11-01 HISTORY — DX: Anemia, unspecified: D64.9

## 2018-11-01 HISTORY — PX: COLONOSCOPY WITH PROPOFOL: SHX5780

## 2018-11-01 HISTORY — DX: Gastro-esophageal reflux disease without esophagitis: K21.9

## 2018-11-01 HISTORY — PX: ESOPHAGOGASTRODUODENOSCOPY (EGD) WITH PROPOFOL: SHX5813

## 2018-11-01 SURGERY — ESOPHAGOGASTRODUODENOSCOPY (EGD) WITH PROPOFOL
Anesthesia: General

## 2018-11-01 MED ORDER — PROPOFOL 500 MG/50ML IV EMUL
INTRAVENOUS | Status: DC | PRN
  Administered 2018-11-01: 125 ug/kg/min via INTRAVENOUS

## 2018-11-01 MED ORDER — SODIUM CHLORIDE 0.9 % IV SOLN
INTRAVENOUS | Status: DC
  Administered 2018-11-01: 11:00:00 via INTRAVENOUS

## 2018-11-01 MED ORDER — PROPOFOL 10 MG/ML IV BOLUS
INTRAVENOUS | Status: DC | PRN
  Administered 2018-11-01: 50 mg via INTRAVENOUS

## 2018-11-01 MED ORDER — FENTANYL CITRATE (PF) 100 MCG/2ML IJ SOLN
INTRAMUSCULAR | Status: AC
  Filled 2018-11-01: qty 2

## 2018-11-01 MED ORDER — FENTANYL CITRATE (PF) 100 MCG/2ML IJ SOLN
INTRAMUSCULAR | Status: DC | PRN
  Administered 2018-11-01: 50 ug via INTRAVENOUS

## 2018-11-01 MED ORDER — MIDAZOLAM HCL 2 MG/2ML IJ SOLN
INTRAMUSCULAR | Status: DC | PRN
  Administered 2018-11-01: 1 mg via INTRAVENOUS

## 2018-11-01 MED ORDER — MIDAZOLAM HCL 2 MG/2ML IJ SOLN
INTRAMUSCULAR | Status: AC
  Filled 2018-11-01: qty 2

## 2018-11-01 MED ORDER — PROPOFOL 500 MG/50ML IV EMUL
INTRAVENOUS | Status: AC
  Filled 2018-11-01: qty 50

## 2018-11-01 MED ORDER — LIDOCAINE HCL (CARDIAC) PF 100 MG/5ML IV SOSY
PREFILLED_SYRINGE | INTRAVENOUS | Status: DC | PRN
  Administered 2018-11-01: 30 mg via INTRAVENOUS

## 2018-11-01 NOTE — Anesthesia Postprocedure Evaluation (Signed)
Anesthesia Post Note  Patient: SHAHIDAH HANIF  Procedure(s) Performed: ESOPHAGOGASTRODUODENOSCOPY (EGD) WITH PROPOFOL (N/A ) COLONOSCOPY WITH PROPOFOL (N/A )  Patient location during evaluation: Endoscopy Anesthesia Type: General Level of consciousness: awake and alert Pain management: pain level controlled Vital Signs Assessment: post-procedure vital signs reviewed and stable Respiratory status: spontaneous breathing, nonlabored ventilation, respiratory function stable and patient connected to nasal cannula oxygen Cardiovascular status: blood pressure returned to baseline and stable Postop Assessment: no apparent nausea or vomiting Anesthetic complications: no     Last Vitals:  Vitals:   11/01/18 1136 11/01/18 1146  BP: 110/84 115/63  Pulse: (!) 59 (!) 57  Resp: (!) 24 18  Temp:    SpO2: 100% 100%    Last Pain:  Vitals:   11/01/18 1146  TempSrc:   PainSc: 0-No pain                 Cleda Mccreedy Aujanae Mccullum

## 2018-11-01 NOTE — Op Note (Signed)
[facility] Gastroenterology Patient Name: Lacey Ramirez Procedure Date: 11/01/2018 10:37 AM MRN: [id number] Account #: [id number] Date of Birth: [birth year] Admit Type: Outpatient Age: 58 Room: [facility] ENDO ROOM 2 Gender: Female Note Status: Finalized Procedure:            Upper GI endoscopy Indications:          Heartburn, Exclusion of Barrett's esophagus Providers:            Toney Reil MD, MD Medicines:            General Anesthesia Complications:        No immediate complications. Estimated blood loss: None. Procedure:            Pre-Anesthesia Assessment:                       - Prior to the procedure, a History and Physical was                        performed, and patient medications and allergies were                        reviewed. The patient is competent. The risks and                        benefits of the procedure and the sedation options and                        risks were discussed with the patient. All questions                        were answered and informed consent was obtained.                        Patient identification and proposed procedure were                        verified by the physician, the nurse, the                        anesthesiologist, the anesthetist and the technician in                        the pre-procedure area in the procedure room in the                        endoscopy suite. Mental Status Examination: alert and                        oriented. Airway Examination: normal oropharyngeal                        airway and neck mobility. Respiratory Examination:                        clear to auscultation. CV Examination: normal.                        Prophylactic Antibiotics: The patient does not require  prophylactic antibiotics. Prior Anticoagulants: The                        patient has taken no previous anticoagulant or                        antiplatelet agents. ASA Grade  Assessment: II - A                        patient with mild systemic disease. After reviewing the                        risks and benefits, the patient was deemed in                        satisfactory condition to undergo the procedure. The                        anesthesia plan was to use general anesthesia.                        Immediately prior to administration of medications, the                        patient was re-assessed for adequacy to receive                        sedatives. The heart rate, respiratory rate, oxygen                        saturations, blood pressure, adequacy of pulmonary                        ventilation, and response to care were monitored                        throughout the procedure. The physical status of the                        patient was re-assessed after the procedure.                       After obtaining informed consent, the endoscope was                        passed under direct vision. Throughout the procedure,                        the patient's blood pressure, pulse, and oxygen                        saturations were monitored continuously. The Endoscope                        was introduced through the mouth, and advanced to the                        second part of duodenum. The upper GI endoscopy was  accomplished without difficulty. The patient tolerated                        the procedure fairly well. Findings:      The duodenal bulb and second portion of the duodenum were normal.      The entire examined stomach was normal.      The cardia and gastric fundus were normal on retroflexion.      Esophagogastric landmarks were identified: the gastroesophageal junction       was found at 34 cm from the incisors.      The gastroesophageal junction and examined esophagus were normal. Impression:           - Normal duodenal bulb and second portion of the                        duodenum.                       -  Normal stomach.                       - Esophagogastric landmarks identified.                       - Normal gastroesophageal junction and esophagus.                       - No specimens collected. Recommendation:       - Follow an antireflux regimen.                       - Use a proton pump inhibitor PO BID for 3 months.                       - Continue present medications. Procedure Code(s):    --- Professional ---                       [phone], Esophagogastroduodenoscopy, flexible, transoral;                        diagnostic, including collection of specimen(s) by                        brushing or washing, when performed (separate procedure) Diagnosis Code(s):    --- Professional ---                       R12, Heartburn CPT copyright 2018 American Medical Association. All rights reserved. The codes documented in this report are preliminary and upon coder review may  be revised to meet current compliance requirements. Dr. Libby Maw Toney Reil MD, MD 11/01/2018 10:51:42 AM This report has been signed electronically. Number of Addenda: 0 Note Initiated On: 11/01/2018 10:37 AM      [facility]

## 2018-11-01 NOTE — Anesthesia Post-op Follow-up Note (Signed)
Anesthesia QCDR form completed.        

## 2018-11-01 NOTE — Op Note (Signed)
[facility] Gastroenterology Patient Name: Lacey Ramirez Procedure Date: 11/01/2018 10:36 AM MRN: [id number] Account #: [id number] Date of Birth: 08-19-1961 Admit Type: Outpatient Age: 58 Room: [facility] ENDO ROOM 2 Gender: Female Note Status: Finalized Procedure:            Colonoscopy Indications:          Screening for colorectal malignant neoplasm, This is                        the patient's first colonoscopy Providers:            Toney Reil MD, MD Referring MD:         No Local Md, MD (Referring MD) Medicines:            Monitored Anesthesia Care Complications:        No immediate complications. Estimated blood loss: None. Procedure:            Pre-Anesthesia Assessment:                       - Prior to the procedure, a History and Physical was                        performed, and patient medications and allergies were                        reviewed. The patient is competent. The risks and                        benefits of the procedure and the sedation options and                        risks were discussed with the patient. All questions                        were answered and informed consent was obtained.                        Patient identification and proposed procedure were                        verified by the physician, the nurse, the                        anesthesiologist, the anesthetist and the technician in                        the pre-procedure area in the procedure room in the                        endoscopy suite. Mental Status Examination: alert and                        oriented. Airway Examination: normal oropharyngeal                        airway and neck mobility. Respiratory Examination:                        clear to auscultation. CV Examination: normal.  Prophylactic Antibiotics: The patient does not require                        prophylactic antibiotics. Prior Anticoagulants: The           patient has taken no previous anticoagulant or                        antiplatelet agents. ASA Grade Assessment: II - A                        patient with mild systemic disease. After reviewing the                        risks and benefits, the patient was deemed in                        satisfactory condition to undergo the procedure. The                        anesthesia plan was to use general anesthesia.                        Immediately prior to administration of medications, the                        patient was re-assessed for adequacy to receive                        sedatives. The heart rate, respiratory rate, oxygen                        saturations, blood pressure, adequacy of pulmonary                        ventilation, and response to care were monitored                        throughout the procedure. The physical status of the                        patient was re-assessed after the procedure.                       After obtaining informed consent, the colonoscope was                        passed under direct vision. Throughout the procedure,                        the patient's blood pressure, pulse, and oxygen                        saturations were monitored continuously. The                        Colonoscope was introduced through the anus and                        advanced to the the cecum, identified by appendiceal  orifice and ileocecal valve. The colonoscopy was                        performed without difficulty. The patient tolerated the                        procedure well. The quality of the bowel preparation                        was evaluated using the BBPS [facility] Bowel Preparation                        Scale) with scores of: Right Colon = 3, Transverse                        Colon = 3 and Left Colon = 3 (entire mucosa seen well                        with no residual staining, small fragments of stool or                         opaque liquid). The total BBPS score equals 9. Findings:      The perianal and digital rectal examinations were normal. Pertinent       negatives include normal sphincter tone and no palpable rectal lesions.      The entire examined colon appeared normal.      The retroflexed view of the distal rectum and anal verge was normal and       showed no anal or rectal abnormalities.      Multiple diverticula were found in the sigmoid colon. Impression:           - The entire examined colon is normal.                       - The distal rectum and anal verge are normal on                        retroflexion view.                       - Diverticulosis in the sigmoid colon.                       - No specimens collected. Recommendation:       - Discharge patient to home (with escort).                       - Resume previous diet today.                       - Continue present medications.                       - Repeat colonoscopy in 10 years for surveillance. Procedure Code(s):    --- Professional ---                       V8592, Colorectal cancer screening; colonoscopy on  individual not meeting criteria for high risk Diagnosis Code(s):    --- Professional ---                       Z12.11, Encounter for screening for malignant neoplasm                        of colon                       K57.30, Diverticulosis of large intestine without                        perforation or abscess without bleeding CPT copyright 2018 American Medical Association. All rights reserved. The codes documented in this report are preliminary and upon coder review may  be revised to meet current compliance requirements. Dr. Libby Mawonini Vanga Toney Reilohini Reddy Vanga MD, MD 11/01/2018 11:13:40 AM This report has been signed electronically. Number of Addenda: 0 Note Initiated On: 11/01/2018 10:36 AM Scope Withdrawal Time: 0 hours 10 minutes 44 seconds  Total Procedure Duration: 0 hours 16 minutes  28 seconds       [facility]

## 2018-11-01 NOTE — Anesthesia Preprocedure Evaluation (Signed)
Anesthesia Evaluation  Patient identified by MRN, date of birth, ID band Patient awake    Reviewed: Allergy & Precautions, H&P , NPO status , Patient's Chart, lab work & pertinent test results  History of Anesthesia Complications Negative for: history of anesthetic complications  Airway Mallampati: III  TM Distance: <3 FB Neck ROM: full    Dental  (+) Chipped   Pulmonary neg pulmonary ROS, neg shortness of breath,           Cardiovascular Exercise Tolerance: Good hypertension, (-) angina(-) Past MI and (-) DOE negative cardio ROS       Neuro/Psych negative neurological ROS  negative psych ROS   GI/Hepatic negative GI ROS, Neg liver ROS, GERD  Medicated and Controlled,  Endo/Other  negative endocrine ROS  Renal/GU negative Renal ROS  negative genitourinary   Musculoskeletal   Abdominal   Peds  Hematology negative hematology ROS (+)   Anesthesia Other Findings Past Medical History: No date: Anemia No date: GERD (gastroesophageal reflux disease) No date: Hypertension  History reviewed. No pertinent surgical history.  BMI    Body Mass Index:  36.54 kg/m      Reproductive/Obstetrics negative OB ROS                             Anesthesia Physical Anesthesia Plan  ASA: III  Anesthesia Plan: General   Post-op Pain Management:    Induction: Intravenous  PONV Risk Score and Plan: Propofol infusion and TIVA  Airway Management Planned: Natural Airway and Nasal Cannula  Additional Equipment:   Intra-op Plan:   Post-operative Plan:   Informed Consent: I have reviewed the patients History and Physical, chart, labs and discussed the procedure including the risks, benefits and alternatives for the proposed anesthesia with the patient or authorized representative who has indicated his/her understanding and acceptance.     Dental Advisory Given  Plan Discussed with:  Anesthesiologist, CRNA and Surgeon  Anesthesia Plan Comments: (Patient consented for risks of anesthesia including but not limited to:  - adverse reactions to medications - risk of intubation if required - damage to teeth, lips or other oral mucosa - sore throat or hoarseness - Damage to heart, brain, lungs or loss of life  Patient voiced understanding.)        Anesthesia Quick Evaluation

## 2018-11-01 NOTE — H&P (Signed)
Arlyss Repress, MD [address]  [address]  [place], [place] [address]  Main: [phone]  Fax: [phone] Pager: [phone]  Primary Care Physician:  Hyman Hopes, MD Primary Gastroenterologist:  Dr. Arlyss Repress  Pre-Procedure History & Physical: HPI:  Lacey Ramirez is a 58 y.o. female is here for an endoscopy and colonoscopy.   Past Medical History:  Diagnosis Date  . Anemia   . GERD (gastroesophageal reflux disease)   . Hypertension     History reviewed. No pertinent surgical history.  Prior to Admission medications   Medication Sig Start Date End Date Taking? Authorizing Provider  Cetirizine HCl 10 MG CAPS Take 10 mg by mouth daily.    [provider]  cyclobenzaprine (FLEXERIL) 10 MG tablet Take 1 tablet (10 mg total) by mouth 3 (three) times daily as needed for muscle spasms. 05/23/17   Governor Rooks, MD  famotidine (PEPCID) 40 MG tablet Take 1 tablet (40 mg total) by mouth every evening. 10/14/15 10/13/16  Myrna Blazer, MD  FLUoxetine (PROZAC) 40 MG capsule Take 40 mg by mouth 2 (two) times daily.    [provider]  hydrochlorothiazide (MICROZIDE) 12.5 MG capsule Take 12.5 mg by mouth daily.    [provider]  hydrOXYzine (ATARAX/VISTARIL) 25 MG tablet Take 25 mg by mouth every 6 (six) hours as needed.    [provider]  omeprazole (PRILOSEC) 40 MG capsule Take 1 capsule (40 mg total) by mouth 2 (two) times daily before a meal for 30 days. 10/12/18 11/11/18  Toney Reil, MD  sucralfate (CARAFATE) 1 g tablet Take 1 g by mouth 4 (four) times daily.    [provider]    Allergies as of 10/12/2018  . (No Known Allergies)    Family History  Problem Relation Age of Onset  . Breast cancer Paternal Aunt   . Breast cancer Maternal Grandmother   . Breast cancer Paternal Grandmother     Social History   Socioeconomic History  . Marital status: Married    Spouse name: Not on file  .  Number of children: Not on file  . Years of education: Not on file  . Highest education level: Not on file  Occupational History  . Not on file  Social Needs  . Financial resource strain: Not on file  . Food insecurity:    Worry: Not on file    Inability: Not on file  . Transportation needs:    Medical: Not on file    Non-medical: Not on file  Tobacco Use  . Smoking status: Never Smoker  . Smokeless tobacco: Never Used  Substance and Sexual Activity  . Alcohol use: Yes    Comment: occasional  . Drug use: No  . Sexual activity: Yes  Lifestyle  . Physical activity:    Days per week: Not on file    Minutes per session: Not on file  . Stress: Not on file  Relationships  . Social connections:    Talks on phone: Not on file    Gets together: Not on file    Attends religious service: Not on file    Active member of club or organization: Not on file    Attends meetings of clubs or organizations: Not on file    Relationship status: Not on file  . Intimate partner violence:    Fear of current or ex partner: Not on file    Emotionally abused: Not on file  Physically abused: Not on file    Forced sexual activity: Not on file  Other Topics Concern  . Not on file  Social History Narrative  . Not on file    Review of Systems: See HPI, otherwise negative ROS  Physical Exam: BP 122/85   Pulse 77   Temp 97.7 F (36.5 C) (Tympanic)   Resp 16   Ht 4\' 8"  (1.422 m)   Wt 163 lb (73.9 kg)   SpO2 99%   BMI 36.54 kg/m  General:   Alert,  pleasant and cooperative in NAD Head:  Normocephalic and atraumatic. Neck:  Supple; no masses or thyromegaly. Lungs:  Clear throughout to auscultation.    Heart:  Regular rate and rhythm. Abdomen:  Soft, nontender and nondistended. Normal bowel sounds, without guarding, and without rebound.   Neurologic:  Alert and  oriented x4;  grossly normal neurologically.  Impression/Plan: Lacey Ramirez is here for an endoscopy and colonoscopy to  be performed for screening for barrett's, H/o colon polyps  Risks, benefits, limitations, and alternatives regarding  endoscopy and colonoscopy have been reviewed with the patient.  Questions have been answered.  All parties agreeable.   Lannette Donath, MD  11/01/2018, 10:40 AM

## 2018-11-01 NOTE — Transfer of Care (Signed)
Immediate Anesthesia Transfer of Care Note  Patient: Lacey Ramirez  Procedure(s) Performed: ESOPHAGOGASTRODUODENOSCOPY (EGD) WITH PROPOFOL (N/A ) COLONOSCOPY WITH PROPOFOL (N/A )  Patient Location: PACU and Endoscopy Unit  Anesthesia Type:General  Level of Consciousness: awake, alert  and oriented  Airway & Oxygen Therapy: Patient Spontanous Breathing  Post-op Assessment: Report given to RN and Post -op Vital signs reviewed and stable  Post vital signs: Reviewed and stable  Last Vitals:  Vitals Value Taken Time  BP    Temp    Pulse    Resp    SpO2      Last Pain:  Vitals:   11/01/18 1020  TempSrc: Tympanic  PainSc: 0-No pain         Complications: No apparent anesthesia complications

## 2018-11-02 ENCOUNTER — Encounter: Payer: Self-pay | Admitting: Gastroenterology

## 2018-11-05 ENCOUNTER — Ambulatory Visit
Admission: RE | Admit: 2018-11-05 | Discharge: 2018-11-05 | Disposition: A | Discharge status: Home / Self Care | Payer: Medicare HMO | Source: Ambulatory Visit | Attending: Internal Medicine | Admitting: Internal Medicine

## 2018-11-05 DIAGNOSIS — Z1231 Encounter for screening mammogram for malignant neoplasm of breast: Secondary | ICD-10-CM | POA: Insufficient documentation

## 2019-01-11 ENCOUNTER — Ambulatory Visit: Payer: Medicare HMO | Admitting: Gastroenterology

## 2020-01-30 ENCOUNTER — Other Ambulatory Visit: Payer: Self-pay

## 2020-01-30 ENCOUNTER — Ambulatory Visit (LOCAL_COMMUNITY_HEALTH_CENTER): Payer: Medicare Other

## 2020-01-30 DIAGNOSIS — Z111 Encounter for screening for respiratory tuberculosis: Secondary | ICD-10-CM

## 2020-01-30 NOTE — Progress Notes (Signed)
[employer] reimbursing [employer] for TST Original form attached to white paper encounter and to R. Sharen Hones box at her work station and copy sent for station.  ,Jossie Ng, RN

## 2020-02-02 ENCOUNTER — Other Ambulatory Visit: Payer: Self-pay

## 2020-02-02 ENCOUNTER — Ambulatory Visit (LOCAL_COMMUNITY_HEALTH_CENTER): Payer: Medicare Other

## 2020-02-02 DIAGNOSIS — Z111 Encounter for screening for respiratory tuberculosis: Secondary | ICD-10-CM

## 2020-02-02 LAB — TB SKIN TEST
Induration: 0 mm
TB Skin Test: NEGATIVE

## 2020-02-16 ENCOUNTER — Other Ambulatory Visit: Payer: Self-pay | Admitting: Internal Medicine

## 2020-02-16 DIAGNOSIS — Z1231 Encounter for screening mammogram for malignant neoplasm of breast: Secondary | ICD-10-CM

## 2020-02-27 ENCOUNTER — Other Ambulatory Visit: Payer: Self-pay | Admitting: Internal Medicine

## 2020-02-27 ENCOUNTER — Ambulatory Visit
Admission: RE | Admit: 2020-02-27 | Discharge: 2020-02-27 | Disposition: A | Discharge status: Home / Self Care | Payer: Medicare Other | Source: Ambulatory Visit | Attending: Internal Medicine | Admitting: Internal Medicine

## 2020-02-27 DIAGNOSIS — Z1231 Encounter for screening mammogram for malignant neoplasm of breast: Secondary | ICD-10-CM

## 2020-03-13 ENCOUNTER — Other Ambulatory Visit: Payer: Self-pay | Admitting: Internal Medicine

## 2020-03-13 DIAGNOSIS — N6320 Unspecified lump in the left breast, unspecified quadrant: Secondary | ICD-10-CM

## 2020-03-21 ENCOUNTER — Ambulatory Visit
Admission: RE | Admit: 2020-03-21 | Discharge: 2020-03-21 | Disposition: A | Discharge status: Home / Self Care | Payer: Medicare Other | Source: Ambulatory Visit | Attending: Internal Medicine | Admitting: Internal Medicine

## 2020-03-21 DIAGNOSIS — N6320 Unspecified lump in the left breast, unspecified quadrant: Secondary | ICD-10-CM

## 2020-03-21 DIAGNOSIS — N6323 Unspecified lump in the left breast, lower outer quadrant: Secondary | ICD-10-CM | POA: Diagnosis not present

## 2020-07-18 ENCOUNTER — Telehealth: Payer: Self-pay

## 2020-07-18 NOTE — Telephone Encounter (Signed)
Returned patients call. Pt said she had received a call from our office. Informed pt she is schedule with [facility] in [place] for 12/01. Pt verbalized understanding.

## 2020-08-27 ENCOUNTER — Emergency Department: Payer: Medicare Other

## 2020-08-27 ENCOUNTER — Emergency Department
Admission: EM | Admit: 2020-08-27 | Discharge: 2020-08-27 | Disposition: A | Discharge status: Home / Self Care | Payer: Medicare Other | Attending: Emergency Medicine | Admitting: Emergency Medicine

## 2020-08-27 ENCOUNTER — Other Ambulatory Visit: Payer: Self-pay

## 2020-08-27 DIAGNOSIS — I10 Essential (primary) hypertension: Secondary | ICD-10-CM | POA: Diagnosis not present

## 2020-08-27 DIAGNOSIS — K2970 Gastritis, unspecified, without bleeding: Secondary | ICD-10-CM | POA: Insufficient documentation

## 2020-08-27 DIAGNOSIS — R0789 Other chest pain: Secondary | ICD-10-CM | POA: Insufficient documentation

## 2020-08-27 DIAGNOSIS — Z79899 Other long term (current) drug therapy: Secondary | ICD-10-CM | POA: Diagnosis not present

## 2020-08-27 DIAGNOSIS — K2900 Acute gastritis without bleeding: Secondary | ICD-10-CM

## 2020-08-27 DIAGNOSIS — R079 Chest pain, unspecified: Secondary | ICD-10-CM | POA: Diagnosis present

## 2020-08-27 LAB — CBC
HCT: 38.6 % (ref 36.0–46.0)
Hemoglobin: 12.7 g/dL (ref 12.0–15.0)
MCH: 30.8 pg (ref 26.0–34.0)
MCHC: 32.9 g/dL (ref 30.0–36.0)
MCV: 93.7 fL (ref 80.0–100.0)
Platelets: 286 10*3/uL (ref 150–400)
RBC: 4.12 MIL/uL (ref 3.87–5.11)
RDW: 15.1 % (ref 11.5–15.5)
WBC: 9.1 10*3/uL (ref 4.0–10.5)
nRBC: 0 % (ref 0.0–0.2)

## 2020-08-27 LAB — BASIC METABOLIC PANEL
Anion gap: 9 (ref 5–15)
BUN: 16 mg/dL (ref 6–20)
CO2: 27 mmol/L (ref 22–32)
Calcium: 9.2 mg/dL (ref 8.9–10.3)
Chloride: 101 mmol/L (ref 98–111)
Creatinine, Ser: 0.83 mg/dL (ref 0.44–1.00)
GFR, Estimated: >60 mL/min (ref >60)
Glucose, Bld: 94 mg/dL (ref 70–99)
Potassium: 3.7 mmol/L (ref 3.5–5.1)
Sodium: 137 mmol/L (ref 135–145)

## 2020-08-27 LAB — TROPONIN I (HIGH SENSITIVITY)
Troponin I (High Sensitivity): 3 ng/L (ref <18)
Troponin I (High Sensitivity): 3 ng/L (ref <18)

## 2020-08-27 MED ORDER — SUCRALFATE 1 G PO TABS
1.0000 g | ORAL_TABLET | Freq: Four times a day (QID) | ORAL | 0 refills | Status: DC
Start: 2020-08-27 — End: 2023-06-05

## 2020-08-27 MED ORDER — DICYCLOMINE HCL 20 MG PO TABS: 20.0000 mg | ORAL_TABLET | Freq: Three times a day (TID) | ORAL | 0 refills | Status: DC

## 2020-08-27 MED ORDER — FAMOTIDINE IN NACL 20-0.9 MG/50ML-% IV SOLN
20.0000 mg | Freq: Once | INTRAVENOUS | Status: AC
  Administered 2020-08-27: 20 mg via INTRAVENOUS
  Filled 2020-08-27: qty 50

## 2020-08-27 MED ORDER — LIDOCAINE VISCOUS HCL 2 % MT SOLN
15.0000 mL | Freq: Once | OROMUCOSAL | Status: AC
  Administered 2020-08-27: 15 mL via ORAL
  Filled 2020-08-27: qty 15

## 2020-08-27 MED ORDER — ALUM & MAG HYDROXIDE-SIMETH 200-200-20 MG/5ML PO SUSP
30.0000 mL | Freq: Once | ORAL | Status: AC
  Administered 2020-08-27: 30 mL via ORAL
  Filled 2020-08-27: qty 30

## 2020-08-27 MED ORDER — IOHEXOL 300 MG/ML  SOLN
100.0000 mL | Freq: Once | INTRAMUSCULAR | Status: AC | PRN
  Administered 2020-08-27: 100 mL via INTRAVENOUS

## 2020-08-27 NOTE — ED Triage Notes (Signed)
First Nurse Note:  Arrives via [facility].  C/O CP x a few weeks. Recent endocsopy and CP has been intermittent since that time.  VS wnl.  EKG wnl.  Per report.

## 2020-08-27 NOTE — ED Triage Notes (Signed)
Pt comes into the ED via EMS from home with c/o left sided chest pain that radiates into the left arm since last night. Pt is in NAD at present.

## 2020-08-27 NOTE — ED Provider Notes (Signed)
[facility] Emergency Department Provider Note  ____________________________________________   Event Date/Time   First MD Initiated Contact with Patient 08/27/20 1028     (approximate)  I have reviewed the triage vital signs and the nursing notes.   HISTORY  Chief Complaint Chest Pain    HPI Lacey Ramirez is a 59 y.o. female  With h/o HTN, GERD, ulcers, here with chest pain. Pt reports that since she had an EGD 2 weeks ago, she's had ongoing intermittent dull, pressure like left-sided chest pain along with some intermittent nausea. Sx do seem worse w/ eating but also are present at rest. She has some radiation of the pain down her left arm. No overt SOB. No diaphoresis. No specific alleviating factors. She's been taking her antacids and meds. She does endorse increased stress recently.        Past Medical History:  Diagnosis Date  . Anemia   . GERD (gastroesophageal reflux disease)   . Hypertension     There are no problems to display for this patient.   Past Surgical History:  Procedure Laterality Date  . COLONOSCOPY WITH PROPOFOL N/A 11/01/2018   Procedure: COLONOSCOPY WITH PROPOFOL;  Surgeon: Toney ReilVanga, Rohini Reddy, MD;  [facility]COPY;  Service: Gastroenterology;  Laterality: N/A;  . ESOPHAGOGASTRODUODENOSCOPY (EGD) WITH PROPOFOL N/A 11/01/2018   Procedure: ESOPHAGOGASTRODUODENOSCOPY (EGD) WITH PROPOFOL;  Surgeon: Toney ReilVanga, Rohini Reddy, MD;  [facility];  Service: Gastroenterology;  Laterality: N/A;    Prior to Admission medications   Medication Sig Start Date End Date Taking? Authorizing Provider  Cetirizine HCl 10 MG CAPS Take 10 mg by mouth daily.    [provider]  cyclobenzaprine (FLEXERIL) 10 MG tablet Take 1 tablet (10 mg total) by mouth 3 (three) times daily as needed for muscle spasms. 05/23/17   Governor RooksLord, Rebecca, MD  dicyclomine (BENTYL) 20 MG tablet Take 1 tablet (20 mg total) by mouth 4 (four) times daily  -  before meals and at bedtime for 5 days. 08/27/20 09/01/20  Shaune PollackIsaacs, Palestine Mosco, MD  FLUoxetine (PROZAC) 40 MG capsule Take 40 mg by mouth 2 (two) times daily.    [provider]  hydrochlorothiazide (MICROZIDE) 12.5 MG capsule Take 12.5 mg by mouth daily.    [provider]  hydrOXYzine (ATARAX/VISTARIL) 25 MG tablet Take 25 mg by mouth every 6 (six) hours as needed.    [provider]  omeprazole (PRILOSEC) 40 MG capsule Take 1 capsule (40 mg total) by mouth 2 (two) times daily before a meal for 30 days. 10/12/18 11/11/18  Toney ReilVanga, Rohini Reddy, MD  sucralfate (CARAFATE) 1 g tablet Take 1 tablet (1 g total) by mouth 4 (four) times daily for 5 days. 08/27/20 09/01/20  Shaune PollackIsaacs, Kiondra Caicedo, MD    Allergies Patient has no known allergies.  Family History  Problem Relation Age of Onset  . Breast cancer Paternal Aunt   . Breast cancer Maternal Grandmother   . Breast cancer Paternal Grandmother     Social History Social History   Tobacco Use  . Smoking status: Never Smoker  . Smokeless tobacco: Never Used  Vaping Use  . Vaping Use: Never used  Substance Use Topics  . Alcohol use: Yes    Comment: occasional  . Drug use: No    Review of Systems  Review of Systems  Constitutional: Positive for fatigue. Negative for fever.  HENT: Negative for congestion and sore throat.   Eyes: Negative for visual disturbance.  Respiratory: Positive for chest  tightness. Negative for cough and shortness of breath.   Cardiovascular: Positive for chest pain.  Gastrointestinal: Positive for nausea. Negative for abdominal pain, diarrhea and vomiting.  Genitourinary: Negative for flank pain.  Musculoskeletal: Negative for back pain and neck pain.  Skin: Negative for rash and wound.  Neurological: Negative for weakness.  All other systems reviewed and are negative.    ____________________________________________  PHYSICAL EXAM:      VITAL SIGNS: ED Triage Vitals  Enc Vitals Group      BP 08/27/20 0912 129/69     Pulse Rate 08/27/20 0912 71     Resp 08/27/20 0912 18     Temp 08/27/20 0912 97.9 F (36.6 C)     Temp Source 08/27/20 0912 Oral     SpO2 08/27/20 0912 100 %     Weight 08/27/20 0917 136 lb (61.7 kg)     Height 08/27/20 0917 4\' 8"  (1.422 m)     Head Circumference --      Peak Flow --      Pain Score 08/27/20 0917 8     Pain Loc --      Pain Edu? --      Excl. in GC? --      Physical Exam Vitals and nursing note reviewed.  Constitutional:      General: She is not in acute distress.    Appearance: She is well-developed.  HENT:     Head: Normocephalic and atraumatic.  Eyes:     Conjunctiva/sclera: Conjunctivae normal.  Cardiovascular:     Rate and Rhythm: Normal rate and regular rhythm.     Heart sounds: Normal heart sounds. No murmur heard. No friction rub.  Pulmonary:     Effort: Pulmonary effort is normal. No respiratory distress.     Breath sounds: Normal breath sounds. No wheezing or rales.  Abdominal:     General: There is no distension.     Palpations: Abdomen is soft.     Tenderness: There is abdominal tenderness in the epigastric area and left upper quadrant.  Musculoskeletal:     Cervical back: Neck supple.  Skin:    General: Skin is warm.     Capillary Refill: Capillary refill takes less than 2 seconds.  Neurological:     Mental Status: She is alert and oriented to person, place, and time.     Motor: No abnormal muscle tone.       ____________________________________________   LABS (all labs ordered are listed, but only abnormal results are displayed)  Labs Reviewed  BASIC METABOLIC PANEL  CBC  POC URINE PREG, ED  TROPONIN I (HIGH SENSITIVITY)  TROPONIN I (HIGH SENSITIVITY)    ____________________________________________  EKG: Normal sinus rhythm, ventricular rate 72.  PR 146, QRS 70, QTc 427.  No acute ST elevations or depressions. ________________________________________  RADIOLOGY All imaging, including  plain films, CT scans, and ultrasounds, independently reviewed by me, and interpretations confirmed via formal radiology reads.  ED MD interpretation:   Chest x-ray: Clear CT abdomen/pelvis: No acute abnormality   Official radiology report(s): DG Chest 2 View  Result Date: 08/27/2020 CLINICAL DATA:  Chest pain over the last few weeks. Recent endoscopy. EXAM: CHEST - 2 VIEW COMPARISON:  05/23/2017 FINDINGS: Heart size is normal. Mediastinal shadows are normal. The lungs are clear. No bronchial thickening. No infiltrate, mass, effusion or collapse. Pulmonary vascularity is normal. No bony abnormality. IMPRESSION: Normal chest. Electronically Signed   By: 07/23/2017 M.D.   On: 08/27/2020 09:44  CT ABDOMEN PELVIS W CONTRAST  Result Date: 08/27/2020 CLINICAL DATA:  59 year old female with left abdominal pain, left shoulder pain after EGD. EXAM: CT ABDOMEN AND PELVIS WITH CONTRAST TECHNIQUE: Multidetector CT imaging of the abdomen and pelvis was performed using the standard protocol following bolus administration of intravenous contrast. CONTRAST:  OMNIPAQUE IOHEXOL 300 MG/ML  SOLN COMPARISON:  CT Abdomen and Pelvis 07/11/2009. FINDINGS: Lower chest: Negative.  No pericardial or pleural effusion. Hepatobiliary: Absent gallbladder.  Negative liver. Pancreas: Negative. Spleen: Negative.  No perisplenic fluid. Adrenals/Urinary Tract: Normal adrenal glands. Stable, normal renal enhancement. Symmetric renal contrast excretion. Normal proximal ureters. No nephrolithiasis or renal lesion identified. Diminutive, unremarkable urinary bladder. Stomach/Bowel: Retained stool in the rectum. Diverticulosis throughout the descending colon and at the splenic flexure. No active inflammation. Redundant transverse colon with retained stool. Negative right colon. Prior appendectomy. Negative terminal ileum. No dilated small bowel. No free air or free fluid. Decompressed stomach appears unremarkable. Negative  duodenum. Vascular/Lymphatic: Major arterial structures in the abdomen and pelvis are patent. Minimal aortic atherosclerosis. Portal venous system is patent. Reproductive: Surgically absent uterus. Diminutive or absent ovaries. Other: No pelvic free fluid. Musculoskeletal: Advanced lumbar facet arthropathy. Associated mild lower lumbar spondylolisthesis. No acute osseous abnormality identified. IMPRESSION: 1. No acute or inflammatory process identified in the abdomen or pelvis. 2. Extensive diverticulosis of the descending colon but no active inflammation. 3. Advanced lumbar facet arthropathy. Electronically Signed   By: Odessa Fleming M.D.   On: 08/27/2020 12:14    ____________________________________________  PROCEDURES   Procedure(s) performed (including Critical Care):  Procedures  ____________________________________________  INITIAL IMPRESSION / MDM / ASSESSMENT AND PLAN / ED COURSE  As part of my medical decision making, I reviewed the following data within the electronic MEDICAL RECORD NUMBER Nursing notes reviewed and incorporated, Old chart reviewed, Notes from prior ED visits, and [place] Controlled Substance Database       *ANNALYNN CENTANNI was evaluated in Emergency Department on 08/27/2020 for the symptoms described in the history of present illness. She was evaluated in the context of the global COVID-19 pandemic, which necessitated consideration that the patient might be at risk for infection with the SARS-CoV-2 virus that causes COVID-19. Institutional protocols and algorithms that pertain to the evaluation of patients at risk for COVID-19 are in a state of rapid change based on information released by regulatory bodies including the CDC and federal and state organizations. These policies and algorithms were followed during the patient's care in the ED.  Some ED evaluations and interventions may be delayed as a result of limited staffing during the pandemic.*     Medical Decision Making:  59 year old female here with somewhat atypical left-sided chest pain and pressure.  EKG shows normal sinus rhythm without evidence of ischemia and troponins are negative x2 with low risk heart score, do not suspect ACS.  Clinically, this seems to have correlated with her EGD and I suspect it could be related to possible gastritis/esophagitis.  She has no evidence of complications on CT, which was independently reviewed by me.  No evidence of perforation or obstruction.  She is otherwise well-appearing.  Her chest x-ray was also reviewed, shows no evidence of pneumonia or pneumothorax.  Feels better with GI cocktail.  Will treat her symptoms with increased antacid regimen and outpatient follow-up.  Return precautions were given.  Patient updated and in agreement with this plan.  ____________________________________________  FINAL CLINICAL IMPRESSION(S) / ED DIAGNOSES  Final diagnoses:  Atypical chest pain  Acute superficial gastritis without hemorrhage     MEDICATIONS GIVEN DURING THIS VISIT:  Medications  alum & mag hydroxide-simeth (MAALOX/MYLANTA) 200-200-20 MG/5ML suspension 30 mL (30 mLs Oral Given 08/27/20 1125)    And  lidocaine (XYLOCAINE) 2 % viscous mouth solution 15 mL (15 mLs Oral Given 08/27/20 1125)  famotidine (PEPCID) IVPB 20 mg premix (0 mg Intravenous Stopped 08/27/20 1155)  iohexol (OMNIPAQUE) 300 MG/ML solution 100 mL (100 mLs Intravenous Contrast Given 08/27/20 1140)     ED Discharge Orders         Ordered    sucralfate (CARAFATE) 1 g tablet  4 times daily        08/27/20 1256    dicyclomine (BENTYL) 20 MG tablet  3 times daily before meals & bedtime        08/27/20 1256           Note:  This document was prepared using Dragon voice recognition software and may include unintentional dictation errors.   Shaune Pollack, MD 08/27/20 1257

## 2020-08-27 NOTE — ED Notes (Signed)
Took over care of pt. Pt resting comfortably. Pt states pain is a 3 in the left arm at this time. Pt in NAD at this time. VSS. Awaiting further orders. Will continue to monitor.

## 2020-12-12 NOTE — Congregational Nurse Program (Signed)
  Dept: [phone]   Congregational Nurse Program Note  Date of Encounter: 12/05/2020  Client requested blood pressure check. States she was recently diagnosed with celiac disease and recommended a gluten free diet. Education on food choices with a gluten free diet.   Past Medical History: Past Medical History:  Diagnosis Date  . Anemia   . GERD (gastroesophageal reflux disease)   . Hypertension     Encounter Details:  CNP Questionnaire - 12/12/20 1422      Questionnaire   Do you give verbal consent to treat you today? Yes    Visit Setting Other   [facility]   Location Patient Served At Not Applicable   [facility]   Patient Status Not Applicable    Medical Provider Yes   Harriett Burns at [facility]    Intervention Assess (including screenings)

## 2020-12-26 NOTE — Congregational Nurse Program (Signed)
  Dept: [phone]   Congregational Nurse Program Note  Date of Encounter: 04/13/2022BP: Client in to clinic for weekly .blood pressure check. 124/80. Discussed gluten free diet, information given.  Past Medical History: Past Medical History:  Diagnosis Date  . Anemia   . GERD (gastroesophageal reflux disease)   . Hypertension     Encounter Details:  CNP Questionnaire - 12/26/20 1218      Questionnaire   Do you give verbal consent to treat you today? Yes    Visit Setting Other   [facility]   Location Patient Served At Not Applicable   [facility]   Patient Status Not Applicable    Medical Provider Yes   Harriett Burns at [facility]    Intervention Assess (including screenings)

## 2021-01-16 NOTE — Congregational Nurse Program (Signed)
  Dept: [phone]   Congregational Nurse Program Note  Date of Encounter: 01/16/2021 [facility] to [facility] blood pressure check.  BP 118/68. She reports taking her BP medications as directed. No new concerns today.  Past Medical History: Past Medical History:  Diagnosis Date  . Anemia   . GERD (gastroesophageal reflux disease)   . Hypertension     Encounter Details:  CNP Questionnaire - 01/16/21 1305      Questionnaire   Do you give verbal consent to treat you today? Yes    Visit Setting Other    Location Patient Served At [facility]    Patient Status Not Applicable    Medical Provider Yes    Insurance Medicaid    Intervention Assess (including screenings)

## 2021-02-13 NOTE — Congregational Nurse Program (Signed)
  Dept: [phone]   Congregational Nurse Program Note  Date of Encounter: 02/13/2021 Client to clinic for BP check, BP 122/68. No new concerns or medication changes.  ast Medical History: Past Medical History:  Diagnosis Date  . Anemia   . GERD (gastroesophageal reflux disease)   . Hypertension     Encounter Details:  CNP Questionnaire - 02/13/21 1130      Questionnaire   Do you give verbal consent to treat you today? Yes    Visit Setting Other    Location Patient Served At [facility]    Patient Status Not Applicable    Medical Provider Yes    Insurance Medicaid    Intervention Assess (including screenings)

## 2021-03-06 NOTE — Congregational Nurse Program (Signed)
  Dept: [phone]   Congregational Nurse Program Note  Date of Encounter: 03/06/2021 Client to clinic with her client for BP check. 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 No concerns or medication changes since last viist.   Past Medical History: Past Medical History:  Diagnosis Date   Anemia    GERD (gastroesophageal reflux disease)    Hypertension     Encounter Details:  CNP Questionnaire - 03/06/21 1328       Questionnaire   Do you give verbal consent to treat you today? Yes    Visit Setting Other    Location Patient Served At [facility]    Patient Status Not Applicable    Medical Provider Yes    Insurance Medicaid    Intervention Assess (including screenings)

## 2021-10-15 DIAGNOSIS — M545 Low back pain, unspecified: Secondary | ICD-10-CM | POA: Diagnosis not present

## 2021-10-15 DIAGNOSIS — M4316 Spondylolisthesis, lumbar region: Secondary | ICD-10-CM | POA: Diagnosis not present

## 2021-10-15 DIAGNOSIS — M791 Myalgia, unspecified site: Secondary | ICD-10-CM | POA: Diagnosis not present

## 2021-10-15 DIAGNOSIS — M1A0720 Idiopathic chronic gout, left ankle and foot, without tophus (tophi): Secondary | ICD-10-CM | POA: Diagnosis not present

## 2021-10-15 DIAGNOSIS — M5136 Other intervertebral disc degeneration, lumbar region: Secondary | ICD-10-CM | POA: Diagnosis not present

## 2021-10-15 DIAGNOSIS — Z79899 Other long term (current) drug therapy: Secondary | ICD-10-CM | POA: Diagnosis not present

## 2021-10-16 DIAGNOSIS — G478 Other sleep disorders: Secondary | ICD-10-CM | POA: Diagnosis not present

## 2021-11-27 DIAGNOSIS — Z0000 Encounter for general adult medical examination without abnormal findings: Secondary | ICD-10-CM | POA: Diagnosis not present

## 2021-11-27 DIAGNOSIS — Z79899 Other long term (current) drug therapy: Secondary | ICD-10-CM | POA: Diagnosis not present

## 2022-03-20 ENCOUNTER — Other Ambulatory Visit: Payer: Self-pay | Admitting: Internal Medicine

## 2022-03-20 DIAGNOSIS — M5416 Radiculopathy, lumbar region: Secondary | ICD-10-CM

## 2022-03-21 ENCOUNTER — Other Ambulatory Visit: Payer: Self-pay | Admitting: Internal Medicine

## 2022-03-21 DIAGNOSIS — Z1231 Encounter for screening mammogram for malignant neoplasm of breast: Secondary | ICD-10-CM

## 2022-03-24 ENCOUNTER — Ambulatory Visit
Admission: RE | Admit: 2022-03-24 | Discharge: 2022-03-24 | Disposition: A | Discharge status: Home / Self Care | Payer: Medicare Other | Source: Ambulatory Visit | Attending: Internal Medicine | Admitting: Internal Medicine

## 2022-03-24 ENCOUNTER — Other Ambulatory Visit: Payer: Self-pay | Admitting: Internal Medicine

## 2022-03-24 DIAGNOSIS — Z1231 Encounter for screening mammogram for malignant neoplasm of breast: Secondary | ICD-10-CM

## 2022-03-26 ENCOUNTER — Other Ambulatory Visit: Payer: Self-pay | Admitting: Internal Medicine

## 2022-03-26 DIAGNOSIS — R928 Other abnormal and inconclusive findings on diagnostic imaging of breast: Secondary | ICD-10-CM

## 2022-04-01 ENCOUNTER — Ambulatory Visit
Admission: RE | Admit: 2022-04-01 | Discharge: 2022-04-01 | Disposition: A | Discharge status: Home / Self Care | Payer: Medicare Other | Source: Ambulatory Visit | Attending: Internal Medicine | Admitting: Internal Medicine

## 2022-04-01 DIAGNOSIS — M5416 Radiculopathy, lumbar region: Secondary | ICD-10-CM | POA: Diagnosis present

## 2022-04-30 ENCOUNTER — Ambulatory Visit
Admission: RE | Admit: 2022-04-30 | Discharge: 2022-04-30 | Disposition: A | Discharge status: Home / Self Care | Payer: Medicare Other | Source: Ambulatory Visit | Attending: Internal Medicine | Admitting: Internal Medicine

## 2022-04-30 ENCOUNTER — Other Ambulatory Visit: Payer: Self-pay | Admitting: Internal Medicine

## 2022-04-30 DIAGNOSIS — R928 Other abnormal and inconclusive findings on diagnostic imaging of breast: Secondary | ICD-10-CM

## 2022-05-06 IMAGING — CR DG CHEST 2V
1 series · 2 of 2 positions shown · non-contrast
Comparison: 05/23/2017

CLINICAL DATA: Chest pain over the last few weeks. Recent
endoscopy.

EXAM:
CHEST - 2 VIEW

[Series 1: dg chest 2 view · 0.14mm/px · 2 of 2 slices shown]
[im 1/2]
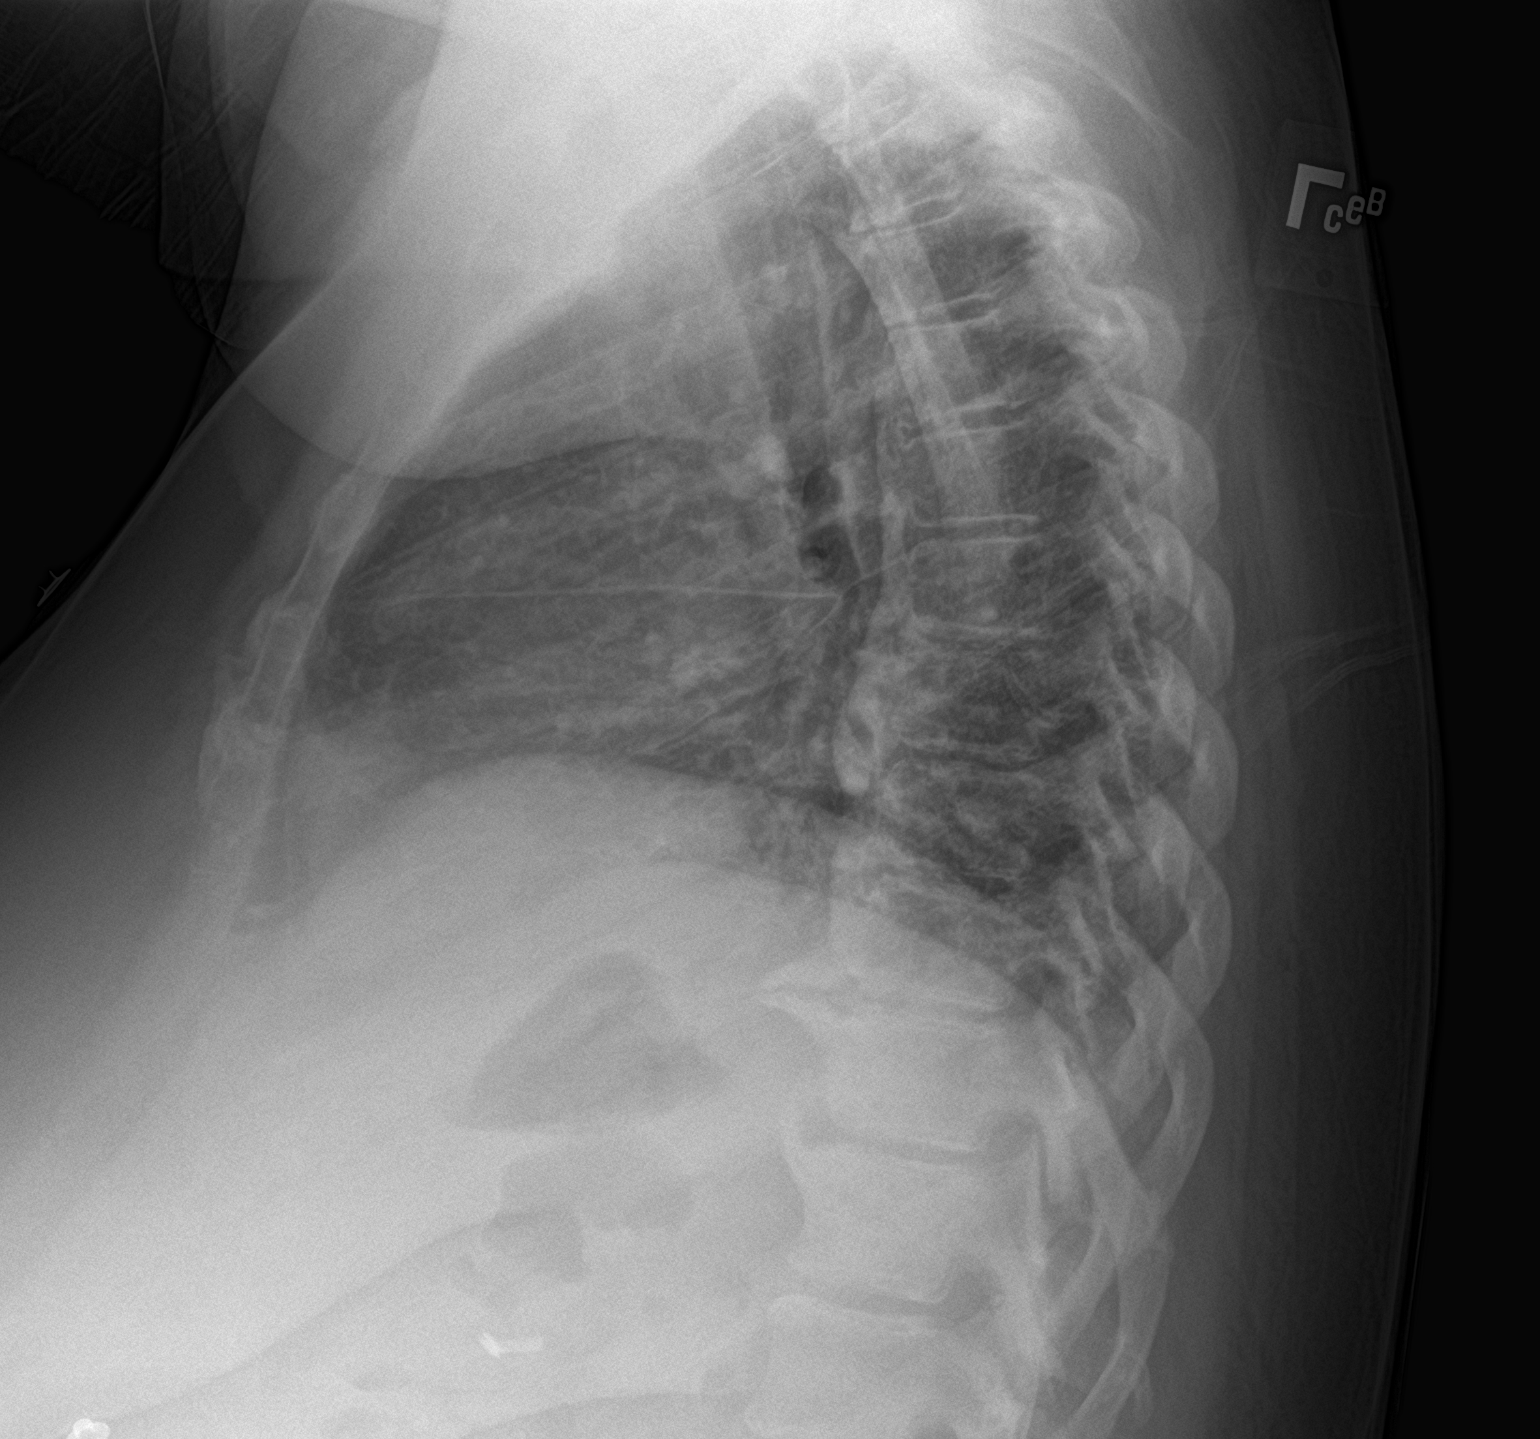
[im 2/2]
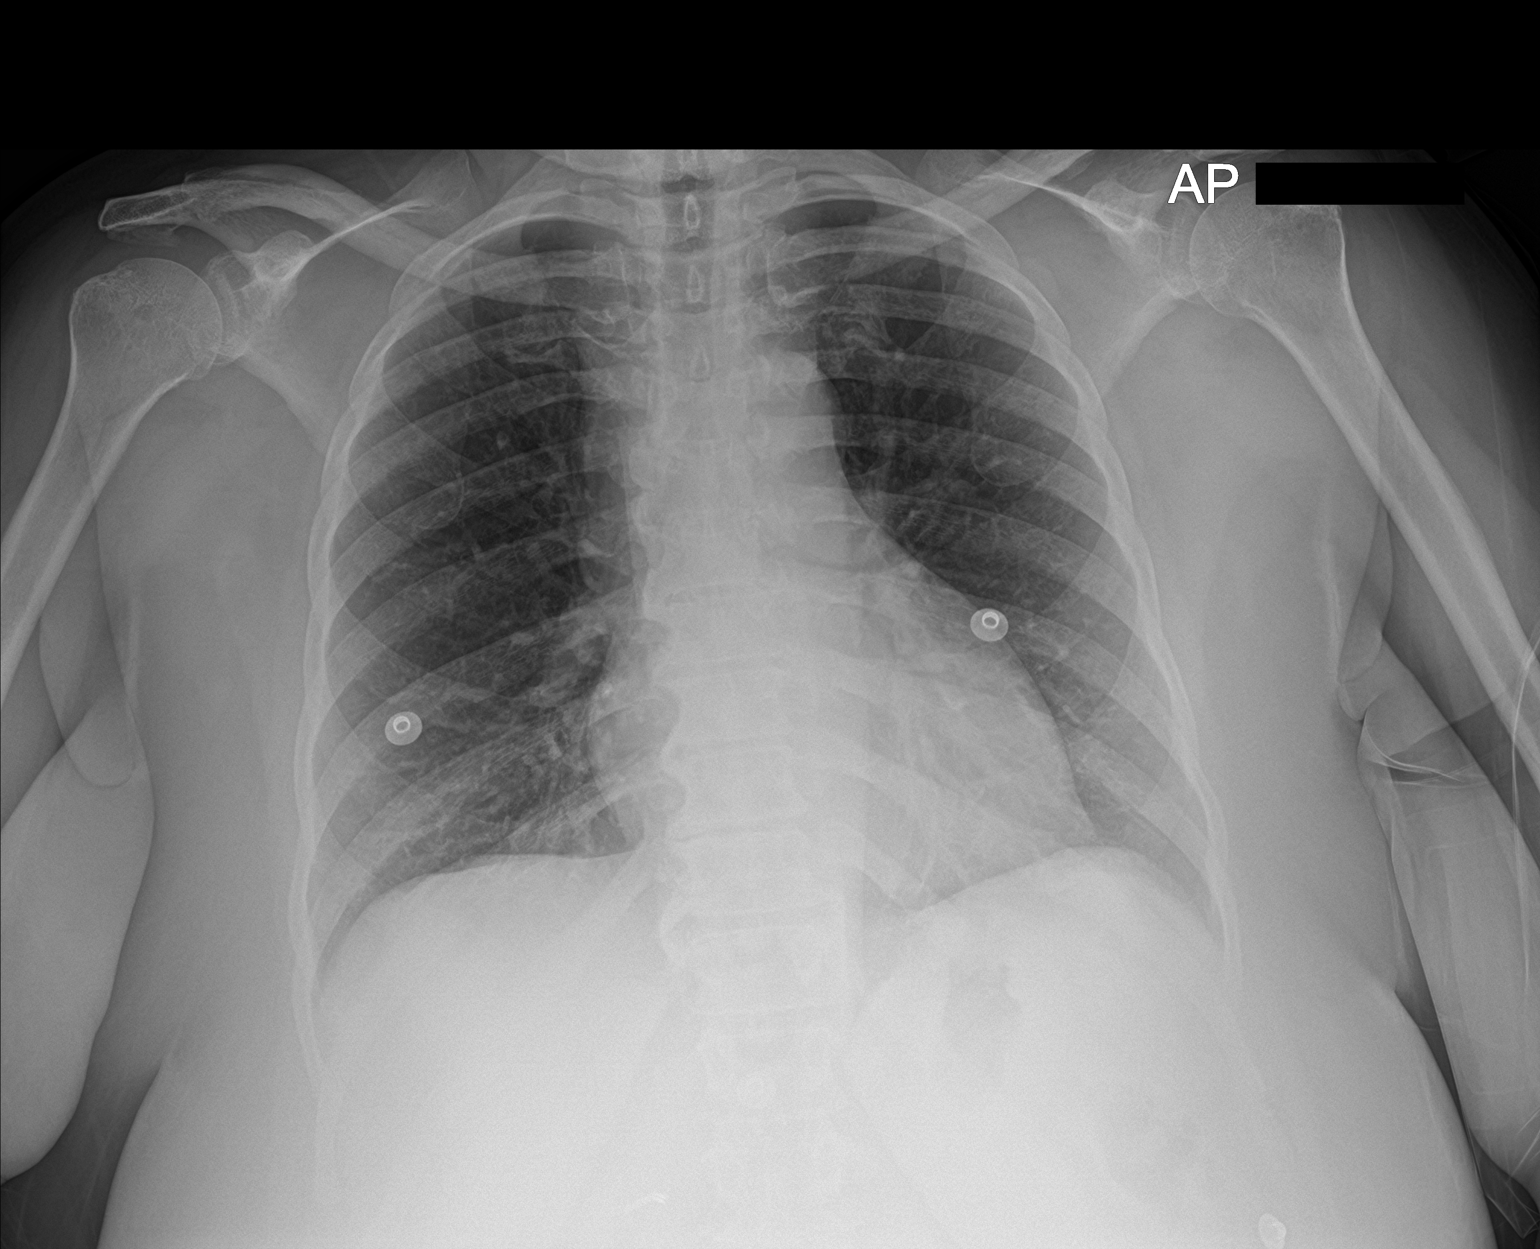

[2 of 2 positions shown; findings below may reference images not displayed]

FINDINGS: Heart size is normal. Mediastinal shadows are normal. The lungs are
clear. No bronchial thickening. No infiltrate, mass, effusion or
collapse. Pulmonary vascularity is normal. No bony abnormality.
IMPRESSION: Normal chest.

## 2022-05-28 ENCOUNTER — Ambulatory Visit: Payer: Medicare Other | Attending: Internal Medicine

## 2022-05-28 DIAGNOSIS — R278 Other lack of coordination: Secondary | ICD-10-CM | POA: Insufficient documentation

## 2022-05-28 DIAGNOSIS — M6281 Muscle weakness (generalized): Secondary | ICD-10-CM | POA: Diagnosis present

## 2022-05-28 DIAGNOSIS — M6289 Other specified disorders of muscle: Secondary | ICD-10-CM | POA: Insufficient documentation

## 2022-05-28 NOTE — Therapy (Signed)
OUTPATIENT PHYSICAL THERAPY FEMALE PELVIC EVALUATION   Patient Name: Lacey Ramirez MRN: [id number] DOB:15-Aug-1961, 61 y.o., female Today's Date: 05/28/2022   PT End of Session - 05/28/22 0844     Visit Number 1    Number of Visits 10    Date for PT Re-Evaluation 08/06/22    Authorization Type IE: 05/28/22    PT Start Time 0850    PT Stop Time 0930    PT Time Calculation (min) 40 min    Activity Tolerance Patient tolerated treatment well             Past Medical History:  Diagnosis Date   Anemia    GERD (gastroesophageal reflux disease)    Hypertension    Past Surgical History:  Procedure Laterality Date   COLONOSCOPY WITH PROPOFOL N/A 11/01/2018   Procedure: COLONOSCOPY WITH PROPOFOL;  Surgeon: Toney Reil, MD;  Location: [facility] ENDOSCOPY;  Service: Gastroenterology;  Laterality: N/A;   ESOPHAGOGASTRODUODENOSCOPY (EGD) WITH PROPOFOL N/A 11/01/2018   Procedure: ESOPHAGOGASTRODUODENOSCOPY (EGD) WITH PROPOFOL;  Surgeon: Toney Reil, MD;  Location: [facility] ENDOSCOPY;  Service: Gastroenterology;  Laterality: N/A;   There are no problems to display for this patient.   PCP: Sumner Boast, MD  REFERRING PROVIDER: Louis Matte, MD   REFERRING DIAG:  [phone] (ICD-10-CM) - Mixed incontinence   THERAPY DIAG:  Pelvic floor dysfunction  Other lack of coordination  Muscle weakness (generalized)  Rationale for Evaluation and Treatment: Rehabilitation  ONSET DATE: 6 months   RED FLAGS: N/A Have you had any night sweats? Unexplained weight loss? Saddle anesthesia? Unexplained changes in bowel or bladder habits?   SUBJECTIVE: Patient confirms identification and approves PT to assess pelvic floor and treatment No                                                                                                                                                                                           PRECAUTIONS: None  WEIGHT BEARING RESTRICTIONS:  No  FALLS:  Has patient fallen in last 6 months? 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 Pt notices she has to use the bathroom frequently. Pt is having some financial stressors as well and not sure if she will be able to afford future appointments. Discussion on calling insurance to get an estimate on PT services each session and Pt will call to cancel appointments if unable to afford.  PAIN:  Are you having pain? No NPRS scale: 0/10    PATIENT GOALS: Pt would like to have more control and strengthen muscles    UROLOGICAL HISTORY Fluid intake: water, apple juice, Gatorade, green tea/sweet tea  Pain with urination: No Fully empty bladder: No Stream: Strong Urgency: Yes Frequency: 8x but can be more than 8 on a "bad day" Nocturia: 3x  Toileting:  Leakage: Walking to the bathroom, Coughing, Sneezing, Laughing, Exercise, Lifting, Bending forward, and Intercourse Pads: Yes Type: Poise medium  Amount: 3x/day (2 pads are more soiled) Bladder control (0-10): 5/10   GASTROINTESTINAL HISTORY Pain with bowel movement: No Type of bowel movement:Type (Bristol Stool Scale) 3 and 4 and Strain Yes occasionally Frequency: 2-3 days, can be every 5x  Fully empty rectum: Yes  Leakage: No Pads: No   SEXUAL HISTORY/FUNCTION Pain with intercourse: no Ability to have vaginal penetration:  Yes; Deep thrusting: Yes Able to achieve orgasm?: Yes  OBSTETRICAL HISTORY Vaginal deliveries: G2P2 C-section: both   GYNECOLOGICAL HISTORY Hysterectomy: no Pelvic Organ Prolapse: None Pain with exam: no Heaviness/pressure: no    OBJECTIVE:    COGNITION: Overall cognitive status: Within functional limits for tasks assessed     POSTURE:  Grossly in  seated B plantarflexion, increased lumbar lordosis Lumbar lordosis:   Thoracic kyphosis: Iliac crest height:  Lumbar lateral shift:  Pelvic obliquity:  Leg length discrepancy:   GAIT: Deferred 2/2 time constraints Distance walked:  Assistive device utilized:  Level of assistance:  Comments:   Trendelenburg:   SENSATION: Deferred 2/2 time constraints Light touch: , L2-S2 dermatomes  Proprioception:    RANGE OF MOTION:  Deferred 2/2 time constraints  (Norm range in degrees)  LEFT  RIGHT   Lumbar forward flexion (65):      Lumbar extension (30):     Lumbar lateral flexion (25):     Thoracic and Lumbar rotation (30 degrees):       Hip Flexion (0-125):      Hip IR (0-45):     Hip ER (0-45):     Hip Adduction:      Hip Abduction (0-40):     Hip extension (0-15):     (*= pain, Blank rows = not tested)   STRENGTH: MMT  Deferred 2/2 time constraints  RLE  LLE   Hip Flexion    Hip Extension    Hip Abduction     Hip Adduction     Hip ER     Hip IR     Knee Extension    Knee Flexion    Dorsiflexion     Plantarflexion (seated)    (*= pain, Blank rows = not tested)   SPECIAL TESTS: Deferred 2/2 time constraints Centralization and Peripheralization (SN 92, -LR 0.12):  Slump (SN 83, -LR 0.32):  SLR (SN 92, -LR 0.29): R: Lumbar quadrant (SN 70): R:  FABER (SN 81): FADIR (SN 94):  Hip scour (SN 50):  Thigh Thrust (SN 88, -LR 0.18) : Distraction [id number]):  Compression (SN/SP 69): Stork/March (SP 93):   PALPATION: Deferred 2/2 time constraints Abdominal:  Diastasis:  finger above umbilicus,  fingers at and below umbilicus  Scar mobility: present/mobile perpendicular, parallel Rib flare: present/absent  EXTERNAL PELVIC EXAM: Patient educated on the purpose of the pelvic exam and articulated understanding; patient consented to the exam verbally. Deferred 2/2 time constraints Palpation: Breath coordination: present/absent/inconsistent Voluntary Contraction:  present/absent Relaxation: full/delayed/non-relaxing Perineal movement with sustained IAP increase ("bear down"): descent/no change/elevation/excessive descent Perineal movement with  rapid IAP increase ("cough"): elevation/no change/descent Pubic symphysis: (0= no contraction, 1= flicker, 2= weak squeeze, 3= fair squeeze with lift, 4= good squeeze and lift against resistance, 5= strong squeeze against strong resistance)   INTERNAL PELVIC EXAM: Patient educated on the purpose of the pelvic exam and articulated understanding; patient consented to the exam verbally. Deferred 2/2 to time constraints Introitus Appears:  Skin integrity:  Scar mobility: Strength (PERF):  Symmetry: Palpation: Prolapse: (0= no contraction, 1= flicker, 2= weak squeeze, 3= fair squeeze with lift, 4= good squeeze and lift against resistance, 5= strong squeeze against strong resistance)    Patient Education:  Patient educated on what to expect during course of physical therapy, POC, and provided with HEP including: toileting posture handout and bladder irritants handout. Patient verbalized understanding and returned demonstration. Patient will benefit from further education in order to maximize compliance and understanding for long-term therapeutic gains.   Patient Surveys:  FOTO Urinary Problem - 38     ASSESSMENT:  Clinical Impression: Patient is a 60 y.o. who was seen today for physical therapy evaluation and treatment for a chief concern of urinary incontinence. Today's evaluation suggest deficits in IAP management, PFM coordination, PFM strength, PFM endurance, posture, and scar mobility as evidenced by increased B plantarflexion during whole session (increase PFM tension) with increased lumbar lordosis in sitting, urinary leakage with coughing/sneezing/laughing/bending/intercourse/walking to the bathroom, use of incontinence pads (moderate) up to 2-3x/day, nocturia (3x), urinary urgency and frequency (up to  8x/day or more), hx of 2 c-sections (increase scar restriction), occasional straining to have a BM, and feeling of incomplete emptying of bladder. Patient's responses on FOTO Urinary Problem (38) indicates significant limitation/disability/distress. Patient's progress may be limited due to financial stressors; however, patient's motivation is advantageous. Pt with basic understanding of PFM function in bowel/bladder habits, sexual function, posture, and the deep core. Patient will benefit from skilled therapeutic intervention to address deficits in IAP management, PFM coordination, PFM strength, PFM endurance, posture, and scar mobility in order to increase PLOF and improve overall QOL.    Objective Impairments: decreased coordination, decreased endurance, decreased strength, increased fascial restrictions, improper body mechanics, and postural dysfunction.   Activity Limitations: lifting, bending, squatting, sleeping, continence, toileting, and locomotion level  Personal Factors: Age, Behavior pattern, Past/current experiences, Time since onset of injury/illness/exacerbation, and 1-2 comorbidities: GERD and HTN  are also affecting patient's functional outcome.   Rehab Potential: Good  Clinical Decision Making: Evolving/moderate complexity  Evaluation Complexity: Moderate   GOALS: Goals reviewed with patient? Yes  SHORT TERM GOALS: Target date: 07/02/2022  Patient will demonstrate independence with HEP in order to maximize therapeutic gains and improve carryover from physical therapy sessions to ADLs in the home and community. Baseline: toileting posture/bladder irritants handout Goal status: INITIAL    LONG TERM GOALS: Target date: 08/06/2022   Patient will score  >/= 52 on FOTO Urinary Problem  in order to demonstrate improved IAP management, improved PFM coordination, and overall QOL.  Baseline: 38 Goal status: INITIAL  2.  Patient will report less than 5 incidents of stress  urinary incontinence over the course of 3 weeks while coughing/sneezing/laughing/physical activity/intercourse/bending in order to demonstrate improved PFM coordination, strength, and function for improved overall QOL. Baseline: urinary leakage with all the above Goal status: INITIAL  3.  Patient will report decreased reliance on protective undergarments as indicated by a 24 hour period to demonstrate improved bladder control and allow for increased participation in activities outside of the home. Baseline: 2-3x/day of  Poise moderate level Goal status: INITIAL  4.  Patient will report confidence in ability to control bladder > 7/10 in order to demonstrate improved function and ability to participate more fully in activities at home and in the community. Baseline: 5/10 Goal status: INITIAL  5.  Patient will report a decrease in voiding intervals during the night in order to demonstrate improved control of the bladder, PFM coordination, sleep quality, and overall QOL.  Baseline: 3x a night Goal status: INITIAL    PLAN: PT Frequency: 1x/week  PT Duration: 10 weeks  Planned Interventions: Therapeutic exercises, Therapeutic activity, Neuromuscular re-education, Balance training, Gait training, Patient/Family education, Self Care, Joint mobilization, Spinal mobilization, Cryotherapy, Moist heat, scar mobilization, Taping, and Manual therapy  Plan For Next Session: phy assess  Taitum Alms, PT, DPT  05/28/2022, 10:27 AM

## 2022-06-04 ENCOUNTER — Ambulatory Visit: Payer: Medicare Other

## 2022-07-19 NOTE — Progress Notes (Deleted)
Psychiatric Initial Adult Assessment   Patient Identification: Lacey Ramirez MRN:  [id number] Date of Evaluation:  07/19/2022 Referral Source: *** Chief Complaint:  No chief complaint on file.  Visit Diagnosis: No diagnosis found.  History of Present Illness:   Lacey Ramirez is a 61 y.o. year old female with a history of depression, fibromyalgia, gout, who is referred for depression.   Used to be seen by psych        Associated Signs/Symptoms: Depression Symptoms:  {DEPRESSION SYMPTOMS:20000} (Hypo) Manic Symptoms:  {BHH MANIC SYMPTOMS:22872} Anxiety Symptoms:  {BHH ANXIETY SYMPTOMS:22873} Psychotic Symptoms:  {BHH PSYCHOTIC SYMPTOMS:22874} PTSD Symptoms: {BHH PTSD SYMPTOMS:22875}  Past Psychiatric History:  Outpatient:  Psychiatry admission:  Previous suicide attempt:  Past trials of medication:  History of violence:  History of head injury:   Previous Psychotropic Medications: {YES/NO:21197}  Substance Abuse History in the last 12 months:  {yes no:314532}  Consequences of Substance Abuse: {BHH CONSEQUENCES OF SUBSTANCE ABUSE:22880}  Past Medical History:  Past Medical History:  Diagnosis Date   Anemia    GERD (gastroesophageal reflux disease)    Hypertension     Past Surgical History:  Procedure Laterality Date   COLONOSCOPY WITH PROPOFOL N/A 11/01/2018   Procedure: COLONOSCOPY WITH PROPOFOL;  Surgeon: Lin Landsman, MD;  Location: [facility] ENDOSCOPY;  Service: Gastroenterology;  Laterality: N/A;   ESOPHAGOGASTRODUODENOSCOPY (EGD) WITH PROPOFOL N/A 11/01/2018   Procedure: ESOPHAGOGASTRODUODENOSCOPY (EGD) WITH PROPOFOL;  Surgeon: Lin Landsman, MD;  Location: [facility];  Service: Gastroenterology;  Laterality: N/A;    Family Psychiatric History: ***  Family History:  Family History  Problem Relation Age of Onset   Breast cancer Paternal Aunt    Breast cancer Maternal Grandmother    Breast cancer Paternal Grandmother     Social  History:   Social History   Socioeconomic History   Marital status: Married    Spouse name: Not on file   Number of children: Not on file   Years of education: Not on file   Highest education level: Not on file  Occupational History   Not on file  Tobacco Use   Smoking status: Never   Smokeless tobacco: Never  Vaping Use   Vaping Use: Never used  Substance and Sexual Activity   Alcohol use: Yes    Comment: occasional   Drug use: No   Sexual activity: Yes  Other Topics Concern   Not on file  Social History Narrative   Not on file   Social Determinants of Health   Financial Resource Strain: Not on file  Food Insecurity: Not on file  Transportation Needs: Not on file  Physical Activity: Not on file  Stress: Not on file  Social Connections: Not on file    Additional Social History: ***  Allergies:  No Known Allergies  Metabolic Disorder Labs: No results found for: "HGBA1C", "MPG" No results found for: "PROLACTIN" No results found for: "CHOL", "TRIG", "HDL", "CHOLHDL", "VLDL", "LDLCALC" No results found for: "TSH"  Therapeutic Level Labs: No results found for: "LITHIUM" No results found for: "CBMZ" No results found for: "VALPROATE"  Current Medications: Current Outpatient Medications  Medication Sig Dispense Refill   Cetirizine HCl 10 MG CAPS Take 10 mg by mouth daily.     cyclobenzaprine (FLEXERIL) 10 MG tablet Take 1 tablet (10 mg total) by mouth 3 (three) times daily as needed for muscle spasms. 21 tablet 0   dicyclomine (BENTYL) 20 MG tablet Take 1 tablet (20 mg total) by mouth 4 (  four) times daily -  before meals and at bedtime for 5 days. 20 tablet 0   FLUoxetine (PROZAC) 40 MG capsule Take 40 mg by mouth 2 (two) times daily.     hydrochlorothiazide (MICROZIDE) 12.5 MG capsule Take 12.5 mg by mouth daily.     hydrOXYzine (ATARAX/VISTARIL) 25 MG tablet Take 25 mg by mouth every 6 (six) hours as needed.     omeprazole (PRILOSEC) 40 MG capsule Take 1 capsule  (40 mg total) by mouth 2 (two) times daily before a meal for 30 days. 60 capsule 0   sucralfate (CARAFATE) 1 g tablet Take 1 tablet (1 g total) by mouth 4 (four) times daily for 5 days. 20 tablet 0   No current facility-administered medications for this visit.    Musculoskeletal: Strength & Muscle Tone: within normal limits Gait & Station: normal Patient leans: N/A  Psychiatric Specialty Exam: Review of Systems  There were no vitals taken for this visit.There is no height or weight on file to calculate BMI.  General Appearance: {Appearance:22683}  Eye Contact:  {BHH EYE CONTACT:22684}  Speech:  Clear and Coherent  Volume:  Normal  Mood:  {BHH MOOD:22306}  Affect:  {Affect (PAA):22687}  Thought Process:  Coherent  Orientation:  Full (Time, Place, and Person)  Thought Content:  Logical  Suicidal Thoughts:  {ST/HT (PAA):22692}  Homicidal Thoughts:  {ST/HT (PAA):22692}  Memory:  Immediate;   Good  Judgement:  {Judgement (PAA):22694}  Insight:  {Insight (PAA):22695}  Psychomotor Activity:  Normal  Concentration:  Concentration: Good and Attention Span: Good  Recall:  Good  Fund of Knowledge:Good  Language: Good  Akathisia:  No  Handed:  Right  AIMS (if indicated):  not done  Assets:  Communication Skills Desire for Improvement  ADL's:  Intact  Cognition: WNL  Sleep:  {BHH GOOD/FAIR/POOR:22877}   Screenings:   Assessment and Plan:  Assessment  Plan   The patient demonstrates the following risk factors for suicide: Chronic risk factors for suicide include: {Chronic Risk Factors for KXFGHWE:99371696}. Acute risk factors for suicide include: {Acute Risk Factors for VELFYBO:17510258}. Protective factors for this patient include: {Protective Factors for Suicide NIDP:82423536}. Considering these factors, the overall suicide risk at this point appears to be {Desc; low/moderate/high:110033}. Patient {ACTION; IS/IS RWE:31540086} appropriate for outpatient follow up.    Collaboration of Care: {BH OP Collaboration of Care:21014065}  Patient/Guardian was advised Release of Information must be obtained prior to any record release in order to collaborate their care with an outside provider. Patient/Guardian was advised if they have not already done so to contact the registration department to sign all necessary forms in order for [place] to release information regarding their care.   Consent: Patient/Guardian gives verbal consent for treatment and assignment of benefits for services provided during this visit. Patient/Guardian expressed understanding and agreed to proceed.   Norman Clay, MD 11/4/20235:45 PM

## 2022-07-22 ENCOUNTER — Ambulatory Visit: Payer: Medicare Other | Admitting: Psychiatry
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
## 2022-09-23 ENCOUNTER — Ambulatory Visit (INDEPENDENT_AMBULATORY_CARE_PROVIDER_SITE_OTHER): Payer: Medicare Other | Admitting: Psychiatry

## 2022-09-23 ENCOUNTER — Other Ambulatory Visit
Admission: RE | Admit: 2022-09-23 | Discharge: 2022-09-23 | Disposition: A | Discharge status: Home / Self Care | Payer: 59 | Source: Ambulatory Visit | Attending: Psychiatry | Admitting: Psychiatry

## 2022-09-23 ENCOUNTER — Encounter: Payer: Self-pay | Admitting: Psychiatry

## 2022-09-23 VITALS — BP 118/79 | HR 92 | Temp 97.3°F | Ht <= 58 in | Wt 190.2 lb

## 2022-09-23 DIAGNOSIS — R4189 Other symptoms and signs involving cognitive functions and awareness: Secondary | ICD-10-CM

## 2022-09-23 DIAGNOSIS — G4700 Insomnia, unspecified: Secondary | ICD-10-CM

## 2022-09-23 DIAGNOSIS — F4310 Post-traumatic stress disorder, unspecified: Secondary | ICD-10-CM | POA: Diagnosis not present

## 2022-09-23 DIAGNOSIS — F331 Major depressive disorder, recurrent, moderate: Secondary | ICD-10-CM | POA: Insufficient documentation

## 2022-09-23 LAB — COMPREHENSIVE METABOLIC PANEL
ALT: 14 U/L (ref 0–44)
AST: 18 U/L (ref 15–41)
Albumin: 3.8 g/dL (ref 3.5–5.0)
Alkaline Phosphatase: 57 U/L (ref 38–126)
Anion gap: 8 (ref 5–15)
BUN: 14 mg/dL (ref 6–20)
CO2: 28 mmol/L (ref 22–32)
Calcium: 9.4 mg/dL (ref 8.9–10.3)
Chloride: 103 mmol/L (ref 98–111)
Creatinine, Ser: 1 mg/dL (ref 0.44–1.00)
GFR, Estimated: >60 mL/min (ref >60)
Glucose, Bld: 129 mg/dL — ABNORMAL HIGH (ref 70–99)
Potassium: 3.7 mmol/L (ref 3.5–5.1)
Sodium: 139 mmol/L (ref 135–145)
Total Bilirubin: 0.6 mg/dL (ref 0.3–1.2)
Total Protein: 7.9 g/dL (ref 6.5–8.1)

## 2022-09-23 LAB — CBC
HCT: 38.9 % (ref 36.0–46.0)
Hemoglobin: 12.9 g/dL (ref 12.0–15.0)
MCH: 31.2 pg (ref 26.0–34.0)
MCHC: 33.2 g/dL (ref 30.0–36.0)
MCV: 94.2 fL (ref 80.0–100.0)
Platelets: 260 10*3/uL (ref 150–400)
RBC: 4.13 MIL/uL (ref 3.87–5.11)
RDW: 14.8 % (ref 11.5–15.5)
WBC: 11.8 10*3/uL — ABNORMAL HIGH (ref 4.0–10.5)
nRBC: 0 % (ref 0.0–0.2)

## 2022-09-23 LAB — VITAMIN B12: Vitamin B-12: 314 pg/mL (ref 180–914)

## 2022-09-23 LAB — FOLATE: Folate: 13.8 ng/mL (ref >5.9)

## 2022-09-23 LAB — TSH: TSH: 2.986 u[IU]/mL (ref 0.350–4.500)

## 2022-09-23 MED ORDER — BUPROPION HCL ER (XL) 150 MG PO TB24: 150.0000 mg | ORAL_TABLET | Freq: Every day | ORAL | 1 refills | Status: DC

## 2022-09-23 MED ORDER — FLUOXETINE HCL 40 MG PO CAPS: 40.0000 mg | ORAL_CAPSULE | Freq: Every day | ORAL | 1 refills | Status: DC

## 2022-09-23 NOTE — Patient Instructions (Addendum)
Continue Fluoxetine 40 mg daily  Start bupropion 150 mg daily  Reduce Risperidone 0.25 mg daily for one week, then discontinue Discontinue depakote Discontinue donepezil  Next appointment: 2/20 at 1:30

## 2022-09-24 ENCOUNTER — Encounter: Payer: Self-pay | Admitting: Psychiatry

## 2022-09-30 ENCOUNTER — Ambulatory Visit
Admission: RE | Admit: 2022-09-30 | Discharge: 2022-09-30 | Disposition: A | Discharge status: Home / Self Care | Payer: 59 | Source: Ambulatory Visit | Attending: Internal Medicine | Admitting: Internal Medicine

## 2022-09-30 ENCOUNTER — Other Ambulatory Visit: Payer: Self-pay | Admitting: Internal Medicine

## 2022-09-30 DIAGNOSIS — R928 Other abnormal and inconclusive findings on diagnostic imaging of breast: Secondary | ICD-10-CM

## 2022-09-30 DIAGNOSIS — R921 Mammographic calcification found on diagnostic imaging of breast: Secondary | ICD-10-CM

## 2022-09-30 DIAGNOSIS — N6320 Unspecified lump in the left breast, unspecified quadrant: Secondary | ICD-10-CM

## 2022-10-21 ENCOUNTER — Other Ambulatory Visit: Payer: 59
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
## 2022-11-04 ENCOUNTER — Ambulatory Visit (INDEPENDENT_AMBULATORY_CARE_PROVIDER_SITE_OTHER): Payer: 59 | Admitting: Psychiatry

## 2022-11-04 ENCOUNTER — Encounter: Payer: Self-pay | Admitting: Psychiatry

## 2022-11-04 VITALS — BP 118/80 | HR 84 | Temp 97.5°F | Ht <= 58 in | Wt 186.0 lb

## 2022-11-04 DIAGNOSIS — R4189 Other symptoms and signs involving cognitive functions and awareness: Secondary | ICD-10-CM

## 2022-11-04 DIAGNOSIS — F4310 Post-traumatic stress disorder, unspecified: Secondary | ICD-10-CM | POA: Diagnosis not present

## 2022-11-04 DIAGNOSIS — G4700 Insomnia, unspecified: Secondary | ICD-10-CM

## 2022-11-04 DIAGNOSIS — F331 Major depressive disorder, recurrent, moderate: Secondary | ICD-10-CM

## 2022-11-04 MED ORDER — FLUOXETINE HCL 40 MG PO CAPS: 40.0000 mg | ORAL_CAPSULE | Freq: Every day | ORAL | 1 refills | Status: DC

## 2022-11-04 MED ORDER — BUPROPION HCL ER (XL) 300 MG PO TB24: 300.0000 mg | ORAL_TABLET | Freq: Every day | ORAL | 1 refills | Status: DC

## 2022-11-04 NOTE — Patient Instructions (Signed)
Continue Fluoxetine 40 mg daily  Increase bupropion 300 mg daily  Referred for evaluation of sleep apnea Next appointment: 4/22 at 1:30

## 2022-12-03 ENCOUNTER — Encounter: Payer: Self-pay | Admitting: Internal Medicine

## 2022-12-03 ENCOUNTER — Ambulatory Visit (INDEPENDENT_AMBULATORY_CARE_PROVIDER_SITE_OTHER): Payer: 59 | Admitting: Internal Medicine

## 2022-12-03 VITALS — BP 124/74 | HR 80 | Temp 97.8°F | Ht <= 58 in | Wt 183.4 lb

## 2022-12-03 DIAGNOSIS — G4719 Other hypersomnia: Secondary | ICD-10-CM | POA: Diagnosis not present

## 2022-12-03 NOTE — Progress Notes (Signed)
Name: Lacey Ramirez MRN: [id number] DOB: 1961-08-16    CHIEF COMPLAINT:  EXCESSIVE DAYTIME SLEEPINESS   HISTORY OF PRESENT ILLNESS: Patient is seen today for problems and issues with sleep related to excessive daytime sleepiness Patient  has been having sleep problems for many years Patient has been having excessive daytime sleepiness for a long time Patient has been having extreme fatigue and tiredness, lack of energy +  very Loud snoring every night sometimes + struggling breathe at night and gasps for air sometimes + Nonrefreshing sleep   Encouraged proper weight management.  Discussed sleep hygiene, and benefits of a fixed sleep waked time.  The importance of getting eight or more hours of sleep discussed with patient.  Discussed limiting the use of the computer and television before bedtime.  Decrease naps during the day, so night time sleep will become enhanced.  Limit caffeine, and sleep deprivation.  HTN, stroke, and heart failure are potential risk factors.    EPWORTH SLEEP SCORE 5   PAST MEDICAL HISTORY :   has a past medical history of ADHD (attention deficit hyperactivity disorder), Anemia, Depression, GERD (gastroesophageal reflux disease), Headache, and Hypertension.  has a past surgical history that includes Esophagogastroduodenoscopy (egd) with propofol (N/A, 11/01/2018) and Colonoscopy with propofol (N/A, 11/01/2018). Prior to Admission medications   Medication Sig Start Date End Date Taking? Authorizing Provider  allopurinol (ZYLOPRIM) 300 MG tablet Take 300 mg by mouth daily.    [provider]  baclofen (LIORESAL) 10 MG tablet Take 10 mg by mouth daily as needed.    [provider]  buPROPion (WELLBUTRIN XL) 300 MG 24 hr tablet Take 1 tablet (300 mg total) by mouth daily. 11/11/22 01/10/23  Norman Clay, MD  Cetirizine HCl 10 MG CAPS Take 10 mg by mouth daily.    [provider]  cyclobenzaprine (FLEXERIL) 10 MG tablet Take 1  tablet (10 mg total) by mouth 3 (three) times daily as needed for muscle spasms. Patient taking differently: Take 10 mg by mouth at bedtime as needed for muscle spasms. 05/23/17   Lisa Roca, MD  dicyclomine (BENTYL) 20 MG tablet Take 1 tablet (20 mg total) by mouth 4 (four) times daily -  before meals and at bedtime for 5 days. 08/27/20 09/01/20  Duffy Bruce, MD  FLUoxetine (PROZAC) 40 MG capsule Take 1 capsule (40 mg total) by mouth daily. 11/22/22 01/21/23  Norman Clay, MD  gabapentin (NEURONTIN) 600 MG tablet Take 600 mg by mouth 3 (three) times daily.    [provider]  hydrochlorothiazide (MICROZIDE) 12.5 MG capsule Take 12.5 mg by mouth daily.    [provider]  hydrOXYzine (ATARAX/VISTARIL) 25 MG tablet Take 25 mg by mouth daily as needed.    [provider]  liver oil-zinc oxide (DESITIN) 40 % ointment Apply 1 Application topically as needed for irritation.    [provider]  omeprazole (PRILOSEC) 40 MG capsule Take 1 capsule (40 mg total) by mouth 2 (two) times daily before a meal for 30 days. 10/12/18 11/11/18  Lin Landsman, MD  sucralfate (CARAFATE) 1 g tablet Take 1 tablet (1 g total) by mouth 4 (four) times daily for 5 days. 08/27/20 09/01/20  Duffy Bruce, MD   No Known Allergies  FAMILY HISTORY:  family history includes Bipolar disorder in her maternal uncle; Breast cancer in her maternal grandmother, paternal aunt, and paternal grandmother; Schizophrenia in her cousin. SOCIAL HISTORY:  reports that she has never smoked. She has never used  smokeless tobacco. She reports that she does not currently use alcohol. She reports that she does not use drugs.   Review of Systems:  Gen:  Denies  fever, sweats, chills weight loss  HEENT: Denies blurred vision, double vision, ear pain, eye pain, hearing loss, nose bleeds, sore throat Cardiac:  No dizziness, chest pain or heaviness, chest tightness,edema, No JVD Resp:   No cough, -sputum  production, -shortness of breath,-wheezing, -hemoptysis,  Gi: Denies swallowing difficulty, stomach pain, nausea or vomiting, diarrhea, constipation, bowel incontinence Gu:  Denies bladder incontinence, burning urine Ext:   Denies Joint pain, stiffness or swelling Skin: Denies  skin rash, easy bruising or bleeding or hives Endoc:  Denies polyuria, polydipsia , polyphagia or weight change Psych:   Denies depression, insomnia or hallucinations  Other:  All other systems negative   ALL OTHER ROS ARE NEGATIVE   BP 124/74 (BP Location: Left Arm, Cuff Size: Normal)   Pulse 80   Temp 97.8 F (36.6 C) (Temporal)   Ht 4\' 8"  (1.422 m)   Wt 183 lb 6.4 oz (83.2 kg)   SpO2 99%   BMI 41.12 kg/m     Physical Examination:   General Appearance: No distress  EYES PERRLA, EOM intact.   NECK Supple, No JVD Pulmonary: normal breath sounds, No wheezing.  CardiovascularNormal S1,S2.  No m/r/g.   Abdomen: Benign, Soft, non-tender. Skin:   warm, no rashes, no ecchymosis  Extremities: normal, no cyanosis, clubbing. Neuro:without focal findings,  speech normal  PSYCHIATRIC: Mood, affect within normal limits.   ALL OTHER ROS ARE NEGATIVE    ASSESSMENT AND PLAN SYNOPSIS  Patient with signs and symptoms of excessive daytime sleepiness with probable underlying diagnosis of obstructive sleep apnea in the setting of obesity and deconditioned state   Recommend Sleep Study for definitve diagnosis  Obesity -recommend significant weight loss -recommend changing diet  Deconditioned state -Recommend increased daily activity and exercise   MEDICATION ADJUSTMENTS/LABS AND TESTS ORDERED: Recommend Sleep Study Recommend weight loss   CURRENT MEDICATIONS REVIEWED AT LENGTH WITH PATIENT TODAY   Patient  satisfied with Plan of action and management. All questions answered  Follow up  3 months  Total Time Spent  35 mins   Maretta Bees Patricia Pesa, M.D.  [facility]   Medical Director [place] Director [facility] Cardio-Pulmonary Department

## 2022-12-03 NOTE — Patient Instructions (Signed)
Obtain Sleep Study to assess for sleep apnea 
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
## 2023-01-05 ENCOUNTER — Ambulatory Visit (INDEPENDENT_AMBULATORY_CARE_PROVIDER_SITE_OTHER): Payer: 59 | Admitting: Psychiatry

## 2023-01-05 ENCOUNTER — Encounter: Payer: Self-pay | Admitting: Psychiatry

## 2023-01-05 VITALS — BP 132/82 | HR 75 | Temp 97.5°F | Ht <= 58 in | Wt 185.0 lb

## 2023-01-05 DIAGNOSIS — F331 Major depressive disorder, recurrent, moderate: Secondary | ICD-10-CM | POA: Diagnosis not present

## 2023-01-05 DIAGNOSIS — F4310 Post-traumatic stress disorder, unspecified: Secondary | ICD-10-CM | POA: Diagnosis not present

## 2023-01-05 MED ORDER — FLUOXETINE HCL 40 MG PO CAPS: 40.0000 mg | ORAL_CAPSULE | Freq: Every day | ORAL | 3 refills | Status: DC

## 2023-01-05 MED ORDER — BUPROPION HCL ER (XL) 300 MG PO TB24: 300.0000 mg | ORAL_TABLET | Freq: Every day | ORAL | 1 refills | Status: DC

## 2023-02-18 ENCOUNTER — Ambulatory Visit: Payer: 59 | Admitting: Adult Health

## 2023-02-18 DIAGNOSIS — G4733 Obstructive sleep apnea (adult) (pediatric): Secondary | ICD-10-CM

## 2023-02-18 DIAGNOSIS — G4719 Other hypersomnia: Secondary | ICD-10-CM

## 2023-02-18 NOTE — Progress Notes (Signed)
@Patient  ID: Lacey Ramirez, female    DOB: 1960-10-02, 62 y.o.   MRN: [id number]  No chief complaint on file.   Referring provider: Sumner Boast A,*  HPI:      No Known Allergies  Immunization History  Administered Date(s) Administered   Influenza-Unspecified 06/16/2022   PPD Test 01/30/2020    Past Medical History:  Diagnosis Date   ADHD (attention deficit hyperactivity disorder)    Anemia    Depression    GERD (gastroesophageal reflux disease)    Headache    Hypertension     Tobacco History: Social History   Tobacco Use  Smoking Status Never  Smokeless Tobacco Never   Counseling given: Not Answered   Outpatient Medications Prior to Visit  Medication Sig Dispense Refill   allopurinol (ZYLOPRIM) 300 MG tablet Take 300 mg by mouth daily.     baclofen (LIORESAL) 10 MG tablet Take 10 mg by mouth daily as needed.     buPROPion (WELLBUTRIN XL) 300 MG 24 hr tablet Take 1 tablet (300 mg total) by mouth daily. 30 tablet 1   Cetirizine HCl 10 MG CAPS Take 10 mg by mouth daily.     cyclobenzaprine (FLEXERIL) 10 MG tablet Take 1 tablet (10 mg total) by mouth 3 (three) times daily as needed for muscle spasms. (Patient taking differently: Take 10 mg by mouth at bedtime as needed for muscle spasms.) 21 tablet 0   dicyclomine (BENTYL) 20 MG tablet Take 1 tablet (20 mg total) by mouth 4 (four) times daily -  before meals and at bedtime for 5 days. 20 tablet 0   FLUoxetine (PROZAC) 40 MG capsule Take 1 capsule (40 mg total) by mouth daily. 30 capsule 3   gabapentin (NEURONTIN) 600 MG tablet Take 600 mg by mouth 3 (three) times daily.     hydrochlorothiazide (MICROZIDE) 12.5 MG capsule Take 12.5 mg by mouth daily.     hydrOXYzine (ATARAX/VISTARIL) 25 MG tablet Take 25 mg by mouth daily as needed.     liver oil-zinc oxide (DESITIN) 40 % ointment Apply 1 Application topically as needed for irritation.     omeprazole (PRILOSEC) 40 MG capsule Take 1 capsule (40 mg total)  by mouth 2 (two) times daily before a meal for 30 days. 60 capsule 0   sucralfate (CARAFATE) 1 g tablet Take 1 tablet (1 g total) by mouth 4 (four) times daily for 5 days. 20 tablet 0   No facility-administered medications prior to visit.      Review of Systems  Review of Systems   Physical Exam  There were no vitals taken for this visit. Physical Exam   Lab Results:  CBC    Component Value Date/Time   WBC 11.8 (H) 09/23/2022 1429   RBC 4.13 09/23/2022 1429   HGB 12.9 09/23/2022 1429   HCT 38.9 09/23/2022 1429   PLT 260 09/23/2022 1429   MCV 94.2 09/23/2022 1429   MCH 31.2 09/23/2022 1429   MCHC 33.2 09/23/2022 1429   RDW 14.8 09/23/2022 1429   LYMPHSABS 1.2 10/14/2015 0717   MONOABS 0.7 10/14/2015 0717   EOSABS 0.0 10/14/2015 0717   BASOSABS 0.2 (H) 10/14/2015 0717    BMET    Component Value Date/Time   NA 139 09/23/2022 1429   K 3.7 09/23/2022 1429   CL 103 09/23/2022 1429   CO2 28 09/23/2022 1429   GLUCOSE 129 (H) 09/23/2022 1429   BUN 14 09/23/2022 1429   CREATININE 1.00 09/23/2022 1429  CALCIUM 9.4 09/23/2022 1429   GFRNONAA >60 09/23/2022 1429   GFRAA >60 05/23/2017 1745    BNP No results found for: "BNP"  ProBNP No results found for: "PROBNP"  Imaging: No results found.   Assessment & Plan:   No problem-specific Assessment & Plan notes found for this encounter.     Glenford Bayley, NP 02/18/2023

## 2023-02-19 DIAGNOSIS — G4733 Obstructive sleep apnea (adult) (pediatric): Secondary | ICD-10-CM | POA: Diagnosis not present

## 2023-03-10 ENCOUNTER — Telehealth: Payer: Self-pay

## 2023-03-10 DIAGNOSIS — G4733 Obstructive sleep apnea (adult) (pediatric): Secondary | ICD-10-CM

## 2023-03-10 NOTE — Telephone Encounter (Signed)
I have notified the patient and placed the order for the CPAP. She will call back once she gets set up with the machine to schedule a 31-90 day follow-up.  Nothing further needed.

## 2023-03-10 NOTE — Telephone Encounter (Addendum)
-----   Message from Erin Fulling, MD sent at 03/10/2023 10:48 AM EDT ----- Diagnosis Mild OSA  Start auto CPAP 4-10 cm h20   ----- Message ----- From: Lilian Kapur Sent: 03/06/2023   4:45 PM EDT To: Erin Fulling, MD
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
## 2023-03-24 ENCOUNTER — Encounter: Payer: Self-pay | Admitting: Psychiatry

## 2023-03-24 ENCOUNTER — Ambulatory Visit (INDEPENDENT_AMBULATORY_CARE_PROVIDER_SITE_OTHER): Payer: 59 | Admitting: Psychiatry

## 2023-03-24 VITALS — BP 122/86 | HR 87 | Temp 98.3°F | Ht <= 58 in | Wt 181.2 lb

## 2023-03-24 DIAGNOSIS — R4189 Other symptoms and signs involving cognitive functions and awareness: Secondary | ICD-10-CM

## 2023-03-24 DIAGNOSIS — F4310 Post-traumatic stress disorder, unspecified: Secondary | ICD-10-CM | POA: Diagnosis not present

## 2023-03-24 DIAGNOSIS — F330 Major depressive disorder, recurrent, mild: Secondary | ICD-10-CM | POA: Diagnosis not present

## 2023-03-24 DIAGNOSIS — G4700 Insomnia, unspecified: Secondary | ICD-10-CM

## 2023-03-24 MED ORDER — BUPROPION HCL ER (XL) 300 MG PO TB24: 300.0000 mg | ORAL_TABLET | Freq: Every day | ORAL | 1 refills | Status: DC
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
## 2023-05-11 ENCOUNTER — Ambulatory Visit (INDEPENDENT_AMBULATORY_CARE_PROVIDER_SITE_OTHER): Payer: 59 | Admitting: Psychiatry

## 2023-05-11 ENCOUNTER — Encounter: Payer: Self-pay | Admitting: Psychiatry

## 2023-05-11 VITALS — BP 118/74 | HR 91 | Temp 97.5°F | Ht <= 58 in | Wt 185.0 lb

## 2023-05-11 DIAGNOSIS — F331 Major depressive disorder, recurrent, moderate: Secondary | ICD-10-CM

## 2023-05-11 DIAGNOSIS — F4310 Post-traumatic stress disorder, unspecified: Secondary | ICD-10-CM | POA: Diagnosis not present

## 2023-05-11 NOTE — Patient Instructions (Addendum)
Continue fluoxetine 40 mg daily  Increase bupropion 450 mg daily   Next appointment: 10/15 at 4 pm

## 2023-05-15 ENCOUNTER — Other Ambulatory Visit: Payer: Self-pay | Admitting: Psychiatry

## 2023-05-15 ENCOUNTER — Telehealth: Payer: Self-pay

## 2023-05-15 MED ORDER — FLUOXETINE HCL 40 MG PO CAPS: 40.0000 mg | ORAL_CAPSULE | Freq: Every day | ORAL | 5 refills | Status: DC

## 2023-05-15 MED ORDER — BUPROPION HCL ER (XL) 300 MG PO TB24: 300.0000 mg | ORAL_TABLET | Freq: Every day | ORAL | 5 refills | Status: DC

## 2023-05-15 NOTE — Telephone Encounter (Signed)
pt needs refills on the bupropion and the fluoxetinet was last seen on 8-26 and next appt 10-15

## 2023-05-15 NOTE — Telephone Encounter (Signed)
Ordered

## 2023-05-15 NOTE — Telephone Encounter (Signed)
Pt.notified

## 2023-06-05 ENCOUNTER — Ambulatory Visit (INDEPENDENT_AMBULATORY_CARE_PROVIDER_SITE_OTHER): Payer: 59 | Admitting: Adult Health

## 2023-06-05 ENCOUNTER — Encounter: Payer: Self-pay | Admitting: Adult Health

## 2023-06-05 VITALS — BP 126/76 | HR 93 | Temp 98.0°F | Ht <= 58 in

## 2023-06-05 DIAGNOSIS — E669 Obesity, unspecified: Secondary | ICD-10-CM | POA: Diagnosis not present

## 2023-06-05 DIAGNOSIS — G4733 Obstructive sleep apnea (adult) (pediatric): Secondary | ICD-10-CM | POA: Insufficient documentation

## 2023-06-05 NOTE — Assessment & Plan Note (Signed)
Healthy weight loss

## 2023-06-05 NOTE — Progress Notes (Signed)
@Patient  ID: Lacey Ramirez, female    DOB: 1960/09/24, 62 y.o.   MRN: [id number]  Chief Complaint  Patient presents with   Follow-up    Referring provider: Louis Matte  HPI: 62 year old female seen for sleep consult December 03, 2022 for snoring and daytime sleepiness found to have mild obstructive sleep apnea  TEST/EVENTS :  Home sleep study done on February 18, 2023 showed mild sleep apnea with AHI at 8.8/hour and SpO2 low at 87%  06/05/2023 Follow up : OSA  Patient presents for a 88-month follow-up.  Patient was seen last visit for a sleep consult for daytime sleepiness and snoring.  She was set up for a home sleep study that was done on February 18, 2023.  This showed mild sleep apnea with AHI at 8.8/hour and SpO2 low at 87%.  She was started on CPAP therapy.  Patient says since last visit she is doing well on CPAP.  She wears it every single night typically gets in about 6 to 7 hours of usage.  Feels that she is benefiting from CPAP with decreased daytime sleepiness.  CPAP download shows excellent compliance with 100% usage.  Daily average usage at 6.5 hours.  Patient is on auto CPAP 4 to 20 cm H2O.  AHI 4.2/hour.  Positive mask leaks.  Daily average pressure at 9.8 cm H2O Using nasal mask .   No Known Allergies  Immunization History  Administered Date(s) Administered   Influenza-Unspecified 06/16/2022   PPD Test 01/30/2020    Past Medical History:  Diagnosis Date   ADHD (attention deficit hyperactivity disorder)    Anemia    Depression    GERD (gastroesophageal reflux disease)    Headache    Hypertension     Tobacco History: Social History   Tobacco Use  Smoking Status Never  Smokeless Tobacco Never   Counseling given: Not Answered   Outpatient Medications Prior to Visit  Medication Sig Dispense Refill   allopurinol (ZYLOPRIM) 300 MG tablet Take 300 mg by mouth daily.     baclofen (LIORESAL) 10 MG tablet Take 10 mg by mouth daily as needed.     buPROPion  (WELLBUTRIN XL) 300 MG 24 hr tablet Take 1 tablet (300 mg total) by mouth daily. 30 tablet 5   Cetirizine HCl 10 MG CAPS Take 10 mg by mouth daily.     cyclobenzaprine (FLEXERIL) 10 MG tablet Take 1 tablet (10 mg total) by mouth 3 (three) times daily as needed for muscle spasms. (Patient taking differently: Take 10 mg by mouth at bedtime as needed for muscle spasms.) 21 tablet 0   FLUoxetine (PROZAC) 40 MG capsule Take 1 capsule (40 mg total) by mouth daily. 30 capsule 5   gabapentin (NEURONTIN) 600 MG tablet Take 600 mg by mouth 3 (three) times daily.     hydrochlorothiazide (MICROZIDE) 12.5 MG capsule Take 12.5 mg by mouth daily.     hydrOXYzine (ATARAX/VISTARIL) 25 MG tablet Take 25 mg by mouth daily as needed.     liver oil-zinc oxide (DESITIN) 40 % ointment Apply 1 Application topically as needed for irritation.     omeprazole (PRILOSEC) 40 MG capsule Take 1 capsule (40 mg total) by mouth 2 (two) times daily before a meal for 30 days. 60 capsule 0   dicyclomine (BENTYL) 20 MG tablet Take 1 tablet (20 mg total) by mouth 4 (four) times daily -  before meals and at bedtime for 5 days. 20 tablet 0   sucralfate (CARAFATE)  1 g tablet Take 1 tablet (1 g total) by mouth 4 (four) times daily for 5 days. 20 tablet 0   No facility-administered medications prior to visit.     Review of Systems:   Constitutional:   No  weight loss, night sweats,  Fevers, chills, fatigue, or  lassitude.  HEENT:   No headaches,  Difficulty swallowing,  Tooth/dental problems, or  Sore throat,                No sneezing, itching, ear ache, nasal congestion, post nasal drip,   CV:  No chest pain,  Orthopnea, PND, swelling in lower extremities, anasarca, dizziness, palpitations, syncope.   GI  No heartburn, indigestion, abdominal pain, nausea, vomiting, diarrhea, change in bowel habits, loss of appetite, bloody stools.   Resp: No shortness of breath with exertion or at rest.  No excess mucus, no productive cough,  No  non-productive cough,  No coughing up of blood.  No change in color of mucus.  No wheezing.  No chest wall deformity  Skin: no rash or lesions.  GU: no dysuria, change in color of urine, no urgency or frequency.  No flank pain, no hematuria   MS:  No joint pain or swelling.  No decreased range of motion.  No back pain.    Physical Exam  BP 126/76 (BP Location: Left Arm, Patient Position: Sitting, Cuff Size: Normal)   Pulse 93   Temp 98 F (36.7 C) (Temporal)   Ht 4\' 8"  (1.422 m)   SpO2 97%   BMI 41.48 kg/m   GEN: A/Ox3; pleasant , NAD, well nourished    HEENT:  [place]/AT,  NOSE-clear, THROAT-clear, no lesions, no postnasal drip or exudate noted. Class 3 MP airway   NECK:  Supple w/ fair ROM; no JVD; normal carotid impulses w/o bruits; no thyromegaly or nodules palpated; no lymphadenopathy.    RESP  Clear  P & A; w/o, wheezes/ rales/ or rhonchi. no accessory muscle use, no dullness to percussion  CARD:  RRR, no m/r/g, no peripheral edema, pulses intact, no cyanosis or clubbing.  GI:   Soft & nt; nml bowel sounds; no organomegaly or masses detected.   Musco: Warm bil, no deformities or joint swelling noted.   Neuro: alert, no focal deficits noted.    Skin: Warm, no lesions or rashes    Lab Results:   BNP No results found for: "BNP"  ProBNP No results found for: "PROBNP"  Imaging: No results found.  Administration History     None           No data to display          No results found for: "NITRICOXIDE"      Assessment & Plan:   OSA (obstructive sleep apnea) Excellent control and compliance on nocturnal CPAP Adjust cpap pressure for comfort    Plan    Patient Instructions  Continue on CPAP At bedtime   Decrease CPAP pressure to Auto CPAP 5 to 12cmH2O.  Keep up good work  Do not drive if sleepy  Follow up in 1 year with Dr. Belia Heman and As needed      Obesity Healthy weight loss     Rubye Oaks, NP 06/05/2023

## 2023-06-05 NOTE — Patient Instructions (Addendum)
Continue on CPAP At bedtime   Decrease CPAP pressure to Auto CPAP 5 to 12cmH2O.  Keep up good work  Do not drive if sleepy  Follow up in 1 year with Dr. Belia Heman and As needed

## 2023-06-05 NOTE — Assessment & Plan Note (Addendum)
Excellent control and compliance on nocturnal CPAP Adjust cpap pressure for comfort    Plan    Patient Instructions  Continue on CPAP At bedtime   Decrease CPAP pressure to Auto CPAP 5 to 12cmH2O.  Keep up good work  Do not drive if sleepy  Follow up in 1 year with Dr. Belia Heman and As needed

## 2023-06-26 NOTE — Progress Notes (Unsigned)
No show

## 2023-06-30 ENCOUNTER — Ambulatory Visit (INDEPENDENT_AMBULATORY_CARE_PROVIDER_SITE_OTHER): Payer: 59 | Admitting: Psychiatry

## 2023-06-30 DIAGNOSIS — Z91199 Patient's noncompliance with other medical treatment and regimen due to unspecified reason: Secondary | ICD-10-CM

## 2023-07-21 ENCOUNTER — Ambulatory Visit (INDEPENDENT_AMBULATORY_CARE_PROVIDER_SITE_OTHER): Payer: 59 | Admitting: Psychiatry

## 2023-07-21 ENCOUNTER — Encounter: Payer: Self-pay | Admitting: Psychiatry

## 2023-07-21 VITALS — BP 129/85 | HR 93 | Temp 97.6°F | Ht <= 58 in | Wt 184.2 lb

## 2023-07-21 DIAGNOSIS — F4310 Post-traumatic stress disorder, unspecified: Secondary | ICD-10-CM

## 2023-07-21 DIAGNOSIS — R4189 Other symptoms and signs involving cognitive functions and awareness: Secondary | ICD-10-CM | POA: Diagnosis not present

## 2023-07-21 DIAGNOSIS — F331 Major depressive disorder, recurrent, moderate: Secondary | ICD-10-CM | POA: Diagnosis not present

## 2023-07-21 MED ORDER — SERTRALINE HCL 50 MG PO TABS: ORAL_TABLET | ORAL | 2 refills | Status: DC

## 2023-07-21 NOTE — Patient Instructions (Signed)
Discontinue fluoxetine  Start sertraline 25 mg at night for one week, then 50 mg at night  Continue bupropion 300 mg daily  Next appointment: 1/7 at 3:30
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
## 2023-08-05 ENCOUNTER — Other Ambulatory Visit: Payer: Self-pay | Admitting: Internal Medicine

## 2023-08-05 DIAGNOSIS — Z1231 Encounter for screening mammogram for malignant neoplasm of breast: Secondary | ICD-10-CM

## 2023-08-12 ENCOUNTER — Encounter: Payer: 59 | Attending: Internal Medicine | Admitting: Dietician

## 2023-08-12 ENCOUNTER — Encounter: Payer: Self-pay | Admitting: Dietician

## 2023-08-12 VITALS — Ht <= 58 in | Wt 179.4 lb

## 2023-08-12 DIAGNOSIS — E785 Hyperlipidemia, unspecified: Secondary | ICD-10-CM | POA: Insufficient documentation

## 2023-08-12 DIAGNOSIS — R7303 Prediabetes: Secondary | ICD-10-CM | POA: Insufficient documentation

## 2023-08-12 DIAGNOSIS — Z6841 Body Mass Index (BMI) 40.0 and over, adult: Secondary | ICD-10-CM | POA: Diagnosis not present

## 2023-08-12 DIAGNOSIS — G4730 Sleep apnea, unspecified: Secondary | ICD-10-CM | POA: Diagnosis not present

## 2023-08-12 DIAGNOSIS — E669 Obesity, unspecified: Secondary | ICD-10-CM

## 2023-08-12 DIAGNOSIS — I10 Essential (primary) hypertension: Secondary | ICD-10-CM | POA: Diagnosis not present

## 2023-08-12 DIAGNOSIS — Z713 Dietary counseling and surveillance: Secondary | ICD-10-CM | POA: Insufficient documentation

## 2023-08-12 DIAGNOSIS — G4733 Obstructive sleep apnea (adult) (pediatric): Secondary | ICD-10-CM

## 2023-08-12 NOTE — Patient Instructions (Addendum)
Try Glory low sodium canned greens (from [facility] and probably other stores too); Healthy Choice can soup ([employer] or [employer] or [employer]) Keep taking walks at [place] or other places to help with stress and to stay active. Compare food portions to hand and aim for palm-size portions of meats, slightly smaller than fist-size portions for starches like potatoes or rice or noodles.  Eat generous portions of low carb veggies.  Keep juices to low sugar kinds and keep to a small glass.

## 2023-08-12 NOTE — Progress Notes (Signed)
Medical Nutrition Therapy: Visit start time: 1315  end time: 1415  Assessment:   Referral Diagnosis: obesity Other medical history/ diagnoses: sleep apnea, hyperlipidemia, HTN Psychosocial issues/ stress concerns: high stress  Medications, supplements: reconciled list in medical record   Current weight: 179.4lbs Height: 4'8" BMI: 40.22   Progress and evaluation:  Patient reports some recent increase in weight due to high stress, depression with mom placed in nursing home, managing estate  No previous weight loss programs/ diets HbA1C 5.8% 12/25/22; fasting BG 129 09/23/22; takes statin for cholesterol Meals are shared with fiancee, and sometimes brother  She reports recent efforts to limit red meats; limits sodas and chooses diet sodas when she does have some. Food allergies: none Reports history of iron deficiency anemia Special diet practices: none Patient seeks help with weight loss to improve breathing, back issues/ pain, increase energy, reduce health risk   Dietary Intake:  Usual eating pattern includes 2 meals and 0-1 snacks per day. Dining out frequency: 1 meals per week. Occ [employer] or  [employer] or chinese Who plans meals/ buys groceries? self Who prepares meals? self  Breakfast: sometimes bacon/ sausage, bagel/ waffle; occ corn flakes (plain) with some added sugar Snack: occ apple or pear or orange or grapefruit Lunch: often skips unless no breakfast; sometimes bagel; leftovers; [place] sandwich Snack: none or same as am Supper: lasagna with garlic bread sometimes with salad; burger; limits pork; fish with bread; chicken some with potatoes/ rice + blackeyed peas, green peas, green beans, leafy greens incl collards, creasy greens; pinto beans Snack: usually none; occ fruit Beverages: mostly water, likes tea, cranberry juice, juice drink with peach and mango, occ diet soda  Physical activity: ADLs due to back pain   Intervention:   Nutrition Care Education:   Basic  nutrition: basic food groups; appropriate nutrient balance; appropriate meal and snack schedule; general nutrition guidelines    Weight control: importance of low sugar and low fat choices; portion control; estimated energy needs for weight loss at  <1200 kcal, provided guidance for 40-45% CHO, 25-30% pro, and about 30% fat; used plate method and food lists for basic meal planning instruction Advanced nutrition: cooking techniques prediabetes: appropriate meal and snack schedule; appropriate carb intake and balance, inclusion of protein source with each meal, at least 3 times a day Hypertension:  identifying high sodium foods, identifying food sources of potassium, magnesium Hyperlipidemia: healthy and unhealthy fats; role of fiber, plant sterols; role of exercise Other: effects of stress on weight and health; importance of engaging in relaxing activities to reduce effects of stress, and discussed options. Food sources of iron   Other intervention notes: Patient voices readiness to work on diet changes. She continues to have extra stress with her mother's care which adds challenge. Established goals for change with input from patient.   Nutritional Diagnosis:  [place]-2.2 Altered nutrition-related laboratory As related to hyperlipidemia, elevated HbA1C.  As evidenced by cholesterol controlled with medication, possible effect of high stress level. [place]-3.3 Overweight/obesity As related to history of excess calories, possible effect of high stress level.  As evidenced by patient with current BMI of 40.22.   Education Materials given:  Designer, industrial/product with food lists, sample meal pattern Sample menus Snacking handout Visit summary with goals/ instructions   Learner/ who was taught:  Patient   Level of understanding: Verbalizes/ demonstrates competency  Demonstrated degree of understanding via:   Teach back Learning barriers: None  Willingness to learn/ readiness for change: Eager, change in  progress  Monitoring and Evaluation:  Dietary intake, exercise, and body weight      follow up:  09/23/23 at 11:30am

## 2023-08-20 ENCOUNTER — Other Ambulatory Visit: Payer: Self-pay | Admitting: Internal Medicine

## 2023-08-20 DIAGNOSIS — R921 Mammographic calcification found on diagnostic imaging of breast: Secondary | ICD-10-CM

## 2023-08-24 ENCOUNTER — Other Ambulatory Visit: Payer: Self-pay | Admitting: Psychiatry

## 2023-08-25 ENCOUNTER — Telehealth: Payer: Self-pay | Admitting: Psychiatry

## 2023-08-25 NOTE — Telephone Encounter (Signed)
Thank you :)

## 2023-08-25 NOTE — Telephone Encounter (Signed)
Called pharmacy as instructed by provider and spoke to [facility] she stated that the automated system will send out request for a refill please disregard and she will fill it off the refills on file

## 2023-08-25 NOTE — Telephone Encounter (Signed)
Please contact pharmacy to verify why we have received the sertraline refill for this patient.  According to review of epic, Dr. Vanetta Shawl send out sertraline 50 mg in November with 2 refills.

## 2023-09-10 ENCOUNTER — Other Ambulatory Visit: Payer: Self-pay | Admitting: Internal Medicine

## 2023-09-10 DIAGNOSIS — R921 Mammographic calcification found on diagnostic imaging of breast: Secondary | ICD-10-CM
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
## 2023-09-22 ENCOUNTER — Ambulatory Visit (INDEPENDENT_AMBULATORY_CARE_PROVIDER_SITE_OTHER): Payer: 59 | Admitting: Psychiatry

## 2023-09-22 ENCOUNTER — Encounter: Payer: Self-pay | Admitting: Psychiatry

## 2023-09-22 VITALS — BP 133/77 | HR 93 | Temp 97.6°F | Ht <= 58 in | Wt 193.8 lb

## 2023-09-22 DIAGNOSIS — F4310 Post-traumatic stress disorder, unspecified: Secondary | ICD-10-CM | POA: Diagnosis not present

## 2023-09-22 DIAGNOSIS — R4189 Other symptoms and signs involving cognitive functions and awareness: Secondary | ICD-10-CM | POA: Diagnosis not present

## 2023-09-22 DIAGNOSIS — F331 Major depressive disorder, recurrent, moderate: Secondary | ICD-10-CM

## 2023-09-22 NOTE — Patient Instructions (Signed)
 Increase sertraline 50 mg at night Restart bupropion 300 mg daily  Next appointment: 2/20 at 11:30

## 2023-09-23 ENCOUNTER — Ambulatory Visit: Payer: 59 | Admitting: Dietician

## 2023-09-28 ENCOUNTER — Telehealth: Payer: Self-pay

## 2023-09-28 NOTE — Telephone Encounter (Signed)
 pt called back. i let pt know that according to [facility] she last picked up in aug and i need to know if she had stopped takin and when. pt states she didn't know she states she had a lot on her and she doesn't know. she wanted to know if you can start her over on the wellbutrin ?  I have added pt to the wait list.

## 2023-09-28 NOTE — Telephone Encounter (Signed)
 pt needs a refill on bupropion. pt was last seen on 1-7 next aot 2-20

## 2023-09-28 NOTE — Telephone Encounter (Signed)
 Advise her to start bupropion 300 mg daily

## 2023-09-28 NOTE — Telephone Encounter (Signed)
 call [facility] to find out if pt had more refills of the wellbutrin she stated that she had 6 refills left. i asked why she had so many refills and when was the last time she got the wellbutrin filled . she last filled 05-07-23.

## 2023-09-28 NOTE — Telephone Encounter (Signed)
 left message asking pt to call our office back. that according to the pharmacy you last picked up rx aug. need to know where she been getting her medication or if she had stopped taking.

## 2023-09-28 NOTE — Telephone Encounter (Signed)
 Could you contact the pharmacy- she should have refill to last until March

## 2023-09-29 NOTE — Telephone Encounter (Signed)
 Pt.notified

## 2023-09-30 ENCOUNTER — Other Ambulatory Visit: Payer: Self-pay | Admitting: Psychiatry

## 2023-09-30 NOTE — Telephone Encounter (Signed)
 pt called states that she needed a refill on the sertraline  she was told that she should have enough until middle of feb. pt states that [facility] told her that she didnt. i told her i would call [facility] and find out and call her back

## 2023-09-30 NOTE — Telephone Encounter (Signed)
 called [facility] they states that she has 2 refills left. that the last time pt picked up rx was on 11-6 and it was #30. they will get rx ready.

## 2023-09-30 NOTE — Telephone Encounter (Signed)
 Noted, thanks!

## 2023-09-30 NOTE — Telephone Encounter (Signed)
 left message that [facility] had rx on hold and that they are going to get it ready for her. also i told her that she is not taking her medication like she is suppose too and that it is very important that she take medication every day and not skip on taking it. She states she been going through a lot with her mom but I told her I understood but that she need to still take her medication as directed. She was told that she had appt in feb. That she needed to make sure she brings her medications with her for her appt.

## 2023-10-07 ENCOUNTER — Other Ambulatory Visit: Payer: 59

## 2023-10-07 ENCOUNTER — Ambulatory Visit: Payer: 59 | Admitting: Dietician

## 2023-10-28 ENCOUNTER — Ambulatory Visit (INDEPENDENT_AMBULATORY_CARE_PROVIDER_SITE_OTHER): Payer: 59 | Admitting: Professional Counselor

## 2023-10-28 ENCOUNTER — Encounter: Payer: Self-pay | Admitting: Dietician

## 2023-10-28 DIAGNOSIS — F4310 Post-traumatic stress disorder, unspecified: Secondary | ICD-10-CM

## 2023-10-28 DIAGNOSIS — F4321 Adjustment disorder with depressed mood: Secondary | ICD-10-CM

## 2023-10-28 NOTE — Progress Notes (Signed)
Have not heard back from patient to reschedule her missed appointment from 10/07/23. Sent notification to referring provider.
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
## 2023-11-02 ENCOUNTER — Telehealth: Payer: Self-pay

## 2023-11-02 NOTE — Telephone Encounter (Signed)
Received fax stating that patient has chosen [facility] to be their primary pharmacy for all maintenance medications called patient to verified patient stated that this would be her pharmacy of choice

## 2023-11-05 ENCOUNTER — Encounter: Payer: Self-pay | Admitting: Psychiatry

## 2023-11-05 ENCOUNTER — Telehealth: Payer: 59 | Admitting: Psychiatry

## 2023-11-05 DIAGNOSIS — R4189 Other symptoms and signs involving cognitive functions and awareness: Secondary | ICD-10-CM | POA: Diagnosis not present

## 2023-11-05 DIAGNOSIS — F4310 Post-traumatic stress disorder, unspecified: Secondary | ICD-10-CM

## 2023-11-05 DIAGNOSIS — F331 Major depressive disorder, recurrent, moderate: Secondary | ICD-10-CM | POA: Diagnosis not present

## 2023-11-05 MED ORDER — BUPROPION HCL ER (XL) 300 MG PO TB24: 300.0000 mg | ORAL_TABLET | Freq: Every day | ORAL | 5 refills | Status: DC

## 2023-11-05 MED ORDER — SERTRALINE HCL 100 MG PO TABS: 100.0000 mg | ORAL_TABLET | Freq: Every day | ORAL | 1 refills | Status: DC

## 2023-11-05 NOTE — Patient Instructions (Signed)
Increase sertraline 100 mg at night  Continue bupropion 300 mg daily  Next appointment: 4/17 a 4 pm

## 2023-11-17 ENCOUNTER — Telehealth: Payer: Self-pay

## 2023-11-23 ENCOUNTER — Telehealth (HOSPITAL_COMMUNITY): Payer: Self-pay

## 2023-11-23 MED ORDER — HYDROXYZINE HCL 25 MG PO TABS: 25.0000 mg | ORAL_TABLET | Freq: Every day | ORAL | 1 refills | Status: AC | PRN

## 2023-11-23 NOTE — Telephone Encounter (Signed)
pt was notified that rx was sent to the pharmacy

## 2023-11-23 NOTE — Telephone Encounter (Signed)
 Medication refill - Call from [facility] requesitng a new Hydroxyzine 25 mg, one daily as needed order for patient., last ordered 10/12/18 for patient but appears from record review of Dr. Bing Matter last note 11/05/23 pt is still on this PRN. Patient's next eval set for 12/31/23.

## 2023-11-25 ENCOUNTER — Ambulatory Visit (INDEPENDENT_AMBULATORY_CARE_PROVIDER_SITE_OTHER): Payer: 59 | Admitting: Professional Counselor

## 2023-11-25 DIAGNOSIS — F4310 Post-traumatic stress disorder, unspecified: Secondary | ICD-10-CM

## 2023-11-25 DIAGNOSIS — F331 Major depressive disorder, recurrent, moderate: Secondary | ICD-10-CM

## 2023-11-25 NOTE — Progress Notes (Unsigned)
  THERAPIST PROGRESS NOTE  Session Time: 3:07 PM - 3:58 PM   Participation Level: Active  Behavioral Response: Casual, Alert, Euthymic  Type of Therapy: Individual Therapy  Treatment Goals addressed: Active OP Depression  LTG: "Getter better. I won't be fussing at my fiance so much."    Start:  11/25/23    Expected End:  11/23/24     STG: "My weight." To reduce stress AEB self-report in symptom reduction and increase in coping skills practice over the next 90 days.    STG: "I can't do it on my own and I've been asking my daughter but they don't want to move on it." To improve assertiveness AEB role-playing interpersonal effectiveness skills over the next 90 days.    ProgressTowards Goals: Initial  Interventions: Motivational Interviewing, Strength-based, and Supportive  Summary: Lacey Ramirez is a 63 y.o. female who presents with a history of depression and PTSD. [name] stated she is overwhelmed dealing with her mom's house situation. She reported she won guardianship but her mother's payee hasn't been paying her rent at the rest home. [name] took care of this herself. She engaged in developing her tx plan. She also engaged in discussion about her daughter/depression/kids/lack of discipline. [name] brought up her previous trauma and ways it has impacted her life. She noted how forgiveness and her faith has helped her move on from this.   Therapist Response: Conducted session with [name]. Began session with check-in/update since previous session. Utilized empathetic and reflective listening. Developed treatment plan with input from [name] on current strengths, needs, and progress towards goals. Used open-ended questions to facilitate discussion and summarized [name]'s thoughts/feelings. Processed her concerns with her daughter and grandchildren and explored solutions. Discussed history of trauma, impact it's had on her life, and ways for acceptance. Scheduled additional appointment and concluded  session.   Suicidal/Homicidal: No  Plan: Return again in 2 weeks.  Diagnosis: Moderate episode of recurrent major depressive disorder (HCC)  PTSD (post-traumatic stress disorder)  Collaboration of Care: Medication Management AEB chart review  Patient/Guardian was advised Release of Information must be obtained prior to any record release in order to collaborate their care with an outside provider. Patient/Guardian was advised if they have not already done so to contact the registration department to sign all necessary forms in order for [place] to release information regarding their care.   Consent: Patient/Guardian gives verbal consent for treatment and assignment of benefits for services provided during this visit. Patient/Guardian expressed understanding and agreed to proceed.   Edmonia Lynch, [facility] 11/25/2023

## 2023-12-01 NOTE — Telephone Encounter (Signed)
 Error

## 2023-12-09 ENCOUNTER — Ambulatory Visit (INDEPENDENT_AMBULATORY_CARE_PROVIDER_SITE_OTHER): Payer: 59 | Admitting: Professional Counselor

## 2023-12-09 DIAGNOSIS — F330 Major depressive disorder, recurrent, mild: Secondary | ICD-10-CM
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
## 2023-12-23 ENCOUNTER — Ambulatory Visit (INDEPENDENT_AMBULATORY_CARE_PROVIDER_SITE_OTHER): Payer: 59 | Admitting: Professional Counselor

## 2023-12-23 DIAGNOSIS — F330 Major depressive disorder, recurrent, mild: Secondary | ICD-10-CM

## 2023-12-23 NOTE — Progress Notes (Unsigned)
   THERAPIST PROGRESS NOTE  Session Time: 3:10 PM - 3:30 PM  Participation Level: {BHH PARTICIPATION LEVEL:22264}  Behavioral Response: {Appearance:22683}{BHH LEVEL OF CONSCIOUSNESS:22305}{BHH MOOD:22306}  Type of Therapy: {CHL AMB BH Type of Therapy:21022741}  Treatment Goals addressed: ***  ProgressTowards Goals: {Progress Towards Goals:21014066}  Interventions: {CHL AMB BH Type of Intervention:21022753}  Summary: CHARI PARMENTER is a 63 y.o. female who presents with ***. Excited and nervous about marriage,   Suicidal/Homicidal: {BHH YES OR NO:22294}{yes/no/with/without intent/plan:22693}  Therapist Response: Conducted session with . Began session with check-in/update since previous session. Utilized empathetic and reflective listening. Scheduled additional appointment and concluded session.  Plan for discharge soon  Plan: Return again in *** weeks.  Diagnosis: No diagnosis found.  Collaboration of Care: Medication Management AEB chart review  Patient/Guardian was advised Release of Information must be obtained prior to any record release in order to collaborate their care with an outside provider. Patient/Guardian was advised if they have not already done so to contact the registration department to sign all necessary forms in order for [place] to release information regarding their care.   Consent: Patient/Guardian gives verbal consent for treatment and assignment of benefits for services provided during this visit. Patient/Guardian expressed understanding and agreed to proceed.   Edmonia Lynch, [facility] 12/23/2023

## 2023-12-26 NOTE — Progress Notes (Signed)
 No show

## 2023-12-29 ENCOUNTER — Other Ambulatory Visit: Payer: Self-pay | Admitting: Psychiatry

## 2023-12-31 ENCOUNTER — Ambulatory Visit (INDEPENDENT_AMBULATORY_CARE_PROVIDER_SITE_OTHER): Payer: 59 | Admitting: Psychiatry

## 2023-12-31 DIAGNOSIS — Z91199 Patient's noncompliance with other medical treatment and regimen due to unspecified reason: Secondary | ICD-10-CM

## 2024-01-01 ENCOUNTER — Other Ambulatory Visit: Payer: Self-pay | Admitting: Psychiatry

## 2024-01-01 NOTE — Telephone Encounter (Signed)
 Could you contact her to schedule in person visit? Plan to fill until her next visit.

## 2024-01-04 ENCOUNTER — Encounter: Payer: Self-pay | Admitting: Psychiatry

## 2024-01-04 ENCOUNTER — Ambulatory Visit (INDEPENDENT_AMBULATORY_CARE_PROVIDER_SITE_OTHER): Admitting: Psychiatry

## 2024-01-04 ENCOUNTER — Other Ambulatory Visit: Payer: Self-pay

## 2024-01-04 VITALS — BP 117/74 | HR 86 | Temp 98.0°F | Ht <= 58 in | Wt 192.4 lb

## 2024-01-04 DIAGNOSIS — F4310 Post-traumatic stress disorder, unspecified: Secondary | ICD-10-CM

## 2024-01-04 DIAGNOSIS — F330 Major depressive disorder, recurrent, mild: Secondary | ICD-10-CM

## 2024-01-04 DIAGNOSIS — R4189 Other symptoms and signs involving cognitive functions and awareness: Secondary | ICD-10-CM | POA: Diagnosis not present

## 2024-01-04 MED ORDER — SERTRALINE HCL 100 MG PO TABS: 150.0000 mg | ORAL_TABLET | Freq: Every day | ORAL | 1 refills | Status: DC
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
## 2024-01-04 NOTE — Telephone Encounter (Signed)
 Patient coming in person today, 01-04-24

## 2024-01-04 NOTE — Patient Instructions (Signed)
 Increase sertraline  150 mg at night  Continue bupropion  300 mg daily  Next appointment: 6/3 at 2:30

## 2024-01-06 ENCOUNTER — Ambulatory Visit (INDEPENDENT_AMBULATORY_CARE_PROVIDER_SITE_OTHER): Admitting: Professional Counselor

## 2024-01-06 DIAGNOSIS — F330 Major depressive disorder, recurrent, mild: Secondary | ICD-10-CM | POA: Diagnosis not present

## 2024-01-06 NOTE — Progress Notes (Unsigned)
  THERAPIST PROGRESS NOTE  Session Time: 3:02 PM - 3:55 PM  Participation Level: Active  Behavioral Response: Casual, Alert, Euthymic  Type of Therapy: Individual Therapy  Treatment Goals addressed: Active OP Depression  LTG: "Getter better. I won't be fussing at my fiance so much."                Start:  11/25/23    Expected End:  11/23/24      STG: "My weight." To reduce stress AEB self-report in symptom reduction and increase in coping skills practice over the next 90 days.     STG: "I can't do it on my own and I've been asking my daughter but they don't want to move on it." To improve assertiveness AEB role-playing interpersonal effectiveness skills over the next 90 days.    ProgressTowards Goals: Progressing  Interventions: Motivational Interviewing and Supportive  Summary: Lacey Ramirez is a 63 y.o. female who presents with a history of depression and PTSD. She appeared alert and oriented x5. She stated the wedding went well and they enjoyed an overnight stay in a hotel. [name] reported some concerns/stressors with her mother's nursing home. She explored ways to problem solve the situation. She also discussed stressors with her daughter and aunt. She was receptive to ways to be assertive and have healthy boundaries. [name] would like to continue therapy as she finds it helpful to have someone outside of family/friends to talk to and get healthy feedback on how to manage these stressors.   Therapist Response: Conducted session with Leary Provencal. Began session with check-in/update since previous session. Utilized empathetic and reflective listening. Used open-ended questions to facilitate discussion and summarized Allani's thoughts/feelings. Explored solutions to stressors and modeled ways to be assertive and set healthy boundaries with family members. Scheduled additional appointment and concluded session.   Suicidal/Homicidal: No  Plan: Return again in 6 weeks.  Diagnosis: Mild episode  of recurrent major depressive disorder (HCC)  Collaboration of Care: Medication Management AEB chart review  Patient/Guardian was advised Release of Information must be obtained prior to any record release in order to collaborate their care with an outside provider. Patient/Guardian was advised if they have not already done so to contact the registration department to sign all necessary forms in order for us  to release information regarding their care.   Consent: Patient/Guardian gives verbal consent for treatment and assignment of benefits for services provided during this visit. Patient/Guardian expressed understanding and agreed to proceed.   Len Quale, [facility] 01/07/2024

## 2024-01-08 ENCOUNTER — Other Ambulatory Visit: Payer: Self-pay | Admitting: Psychiatry
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
## 2024-02-16 ENCOUNTER — Encounter: Payer: Self-pay | Admitting: Psychiatry

## 2024-02-16 ENCOUNTER — Other Ambulatory Visit: Payer: Self-pay | Admitting: Psychiatry

## 2024-02-16 ENCOUNTER — Ambulatory Visit (INDEPENDENT_AMBULATORY_CARE_PROVIDER_SITE_OTHER): Admitting: Psychiatry

## 2024-02-16 ENCOUNTER — Other Ambulatory Visit: Payer: Self-pay

## 2024-02-16 VITALS — BP 111/74 | HR 81 | Temp 97.9°F | Ht <= 58 in | Wt 189.4 lb

## 2024-02-16 DIAGNOSIS — F330 Major depressive disorder, recurrent, mild: Secondary | ICD-10-CM | POA: Diagnosis not present

## 2024-02-16 DIAGNOSIS — F4310 Post-traumatic stress disorder, unspecified: Secondary | ICD-10-CM

## 2024-02-16 MED ORDER — SERTRALINE HCL 100 MG PO TABS: 200.0000 mg | ORAL_TABLET | Freq: Every day | ORAL | 1 refills | Status: DC

## 2024-02-16 NOTE — Patient Instructions (Signed)
 Increase sertraline  200 mg at night  Continue bupropion  300 mg daily  Next appointment: 7/17 at 2:30

## 2024-02-17 ENCOUNTER — Ambulatory Visit (INDEPENDENT_AMBULATORY_CARE_PROVIDER_SITE_OTHER): Admitting: Professional Counselor

## 2024-02-17 DIAGNOSIS — F330 Major depressive disorder, recurrent, mild: Secondary | ICD-10-CM

## 2024-02-17 NOTE — Progress Notes (Signed)
  THERAPIST PROGRESS NOTE  Session Time: 10:19 AM - 10:51 AM   Participation Level: Active  Behavioral Response: Well Groomed, Alert, Anxious  Type of Therapy: Individual Therapy  Treatment Goals addressed:  Active OP Depression  LTG: "Getter better. I won't be fussing at my fiance so much."                Start:  11/25/23    Expected End:  11/23/24      STG: "My weight." To reduce stress AEB self-report in symptom reduction and increase in coping skills practice over the next 90 days.     STG: "I can't do it on my own and I've been asking my daughter but they don't want to move on it." To improve assertiveness AEB role-playing interpersonal effectiveness skills over the next 90 days.    ProgressTowards Goals: Progressing  Interventions: Motivational Interviewing, Conservator, museum/gallery, and Supportive  Summary: Lacey Ramirez is a 63 y.o. female who presents with a history of depression and PTSD. She appeared alert and oriented x5. She stated she has been trying to work out some things with her mother, guardianship, and payee. Lacey Ramirez expressed the stress of this situation. She also discussed ongoing stress at home with her daughter/grandchildren. She would like to be more assertive but struggles with feeling guilty about potentially putting her daughter out. She reported her and her husband have been looking into getting another place and allowing her daughter to take over their lease. Lacey Ramirez expressed understanding about the importance of arriving to her appointments on time.  Therapist Response: Conducted session with Lacey Ramirez. Began session with check-in/update since previous session. Utilized empathetic and reflective listening. Used open-ended questions to facilitate discussion and summarized Lacey Ramirez's thoughts/feelings. Explored solutions to both situations and modeled assertive responses. Reminded Lacey Ramirez of [facility] no-show policy and the importance of arriving to her scheduled  appointments early/on time. Scheduled additional appointment and concluded session.   Suicidal/Homicidal: No  Plan: Return again in 6 weeks.  Diagnosis: Mild episode of recurrent major depressive disorder (HCC)  Collaboration of Care: Medication Management AEB chart review  Patient/Guardian was advised Release of Information must be obtained prior to any record release in order to collaborate their care with an outside provider. Patient/Guardian was advised if they have not already done so to contact the registration department to sign all necessary forms in order for us  to release information regarding their care.   Consent: Patient/Guardian gives verbal consent for treatment and assignment of benefits for services provided during this visit. Patient/Guardian expressed understanding and agreed to proceed.   Len Quale, [facility] 02/17/2024
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
## 2024-03-30 ENCOUNTER — Ambulatory Visit (INDEPENDENT_AMBULATORY_CARE_PROVIDER_SITE_OTHER): Admitting: Professional Counselor

## 2024-03-30 DIAGNOSIS — F330 Major depressive disorder, recurrent, mild: Secondary | ICD-10-CM

## 2024-03-30 NOTE — Progress Notes (Signed)
  THERAPIST PROGRESS NOTE  Session Time: 2:30 PM - 3:15 PM   Participation Level: Active  Behavioral Response: Well Groomed, Alert, Euthymic  Type of Therapy: Individual Therapy  Treatment Goals addressed: Active OP Depression  LTG: Getter better. I won't be fussing at my fiance so much.                Start:  11/25/23    Expected End:  11/23/24      STG: My weight. To reduce stress AEB self-report in symptom reduction and increase in coping skills practice over the next 90 days.     STG: I can't do it on my own and I've been asking my daughter but they don't want to move on it. To improve assertiveness AEB role-playing interpersonal effectiveness skills over the next 90 days.    ProgressTowards Goals: Progressing  Interventions: Motivational Interviewing and Supportive and Assertiveness Training  Summary: Lacey Ramirez is a 63 y.o. female who presents with a history of depression and PTSD. She appeared alert and oriented x5. She reported she went to court for her mother's property but it was continued. She is the payee for her mother now but the lawyer still has control over the property. She is confused with this situation. [name] discussed the situation with her daughter still living with her. She was receptive to [employer] and will see if it helps. [name] noted progress on goals and areas for continued improvement.   Therapist Response: Conducted session with [name]. Began session with check-in/update since previous session. Utilized empathetic and reflective listening. Used open-ended questions to facilitate discussion and summarized thoughts/feelings. Explained and modeled DEAR MAN skill for objective effectiveness. Reviewed treatment plan with input from [place] on current strengths, needs, and progress towards goals. Scheduled additional appointment and concluded session.   Suicidal/Homicidal: No  Plan: Return again in 6 weeks.  Diagnosis: Mild episode of recurrent  major depressive disorder (HCC)  Collaboration of Care: Medication Management AEB chart review  Patient/Guardian was advised Release of Information must be obtained prior to any record release in order to collaborate their care with an outside provider. Patient/Guardian was advised if they have not already done so to contact the registration department to sign all necessary forms in order for us  to release information regarding their care.   Consent: Patient/Guardian gives verbal consent for treatment and assignment of benefits for services provided during this visit. Patient/Guardian expressed understanding and agreed to proceed.   Almarie JONETTA Ligas, [facility] 03/30/2024

## 2024-03-31 ENCOUNTER — Ambulatory Visit (INDEPENDENT_AMBULATORY_CARE_PROVIDER_SITE_OTHER): Admitting: Psychiatry

## 2024-03-31 ENCOUNTER — Encounter: Payer: Self-pay | Admitting: Psychiatry

## 2024-03-31 ENCOUNTER — Other Ambulatory Visit: Payer: Self-pay

## 2024-03-31 VITALS — BP 111/73 | HR 89 | Temp 97.4°F | Ht <= 58 in | Wt 193.0 lb

## 2024-03-31 DIAGNOSIS — R4189 Other symptoms and signs involving cognitive functions and awareness: Secondary | ICD-10-CM | POA: Diagnosis not present

## 2024-03-31 DIAGNOSIS — F330 Major depressive disorder, recurrent, mild: Secondary | ICD-10-CM | POA: Diagnosis not present

## 2024-03-31 DIAGNOSIS — F4310 Post-traumatic stress disorder, unspecified: Secondary | ICD-10-CM

## 2024-03-31 MED ORDER — SERTRALINE HCL 100 MG PO TABS: 200.0000 mg | ORAL_TABLET | Freq: Every day | ORAL | 3 refills | Status: DC

## 2024-03-31 NOTE — Patient Instructions (Signed)
 Continue sertraline  200 mg at night Continue bupropion  300 mg daily  Next appointment: 9/11 at 2:30

## 2024-04-05 ENCOUNTER — Other Ambulatory Visit: Payer: Self-pay | Admitting: Internal Medicine

## 2024-04-05 ENCOUNTER — Ambulatory Visit
Admission: RE | Admit: 2024-04-05 | Discharge: 2024-04-05 | Disposition: A | Discharge status: Home / Self Care | Source: Ambulatory Visit | Attending: Internal Medicine | Admitting: Internal Medicine

## 2024-04-05 ENCOUNTER — Ambulatory Visit
Admission: RE | Admit: 2024-04-05 | Discharge: 2024-04-05 | Disposition: A | Discharge status: Home / Self Care | Source: Ambulatory Visit | Attending: Internal Medicine

## 2024-04-05 DIAGNOSIS — N6310 Unspecified lump in the right breast, unspecified quadrant: Secondary | ICD-10-CM

## 2024-04-05 DIAGNOSIS — R921 Mammographic calcification found on diagnostic imaging of breast: Secondary | ICD-10-CM | POA: Diagnosis present

## 2024-04-27 ENCOUNTER — Other Ambulatory Visit: Payer: Self-pay | Admitting: Gastroenterology

## 2024-04-27 ENCOUNTER — Encounter: Payer: Self-pay | Admitting: Gastroenterology

## 2024-04-27 ENCOUNTER — Encounter
Admission: RE | Disposition: A | Discharge status: Home / Self Care | Payer: Self-pay | Source: Home / Self Care | Attending: Gastroenterology

## 2024-04-27 ENCOUNTER — Ambulatory Visit: Admitting: Anesthesiology

## 2024-04-27 ENCOUNTER — Ambulatory Visit
Admission: RE | Admit: 2024-04-27 | Discharge: 2024-04-27 | Disposition: A | Discharge status: Home / Self Care | Attending: Gastroenterology | Admitting: Gastroenterology

## 2024-04-27 DIAGNOSIS — K5730 Diverticulosis of large intestine without perforation or abscess without bleeding: Secondary | ICD-10-CM | POA: Insufficient documentation

## 2024-04-27 DIAGNOSIS — K635 Polyp of colon: Secondary | ICD-10-CM | POA: Insufficient documentation

## 2024-04-27 DIAGNOSIS — K219 Gastro-esophageal reflux disease without esophagitis: Secondary | ICD-10-CM | POA: Insufficient documentation

## 2024-04-27 DIAGNOSIS — K640 First degree hemorrhoids: Secondary | ICD-10-CM | POA: Diagnosis not present

## 2024-04-27 DIAGNOSIS — K625 Hemorrhage of anus and rectum: Secondary | ICD-10-CM | POA: Diagnosis not present

## 2024-04-27 DIAGNOSIS — Z79899 Other long term (current) drug therapy: Secondary | ICD-10-CM | POA: Insufficient documentation

## 2024-04-27 DIAGNOSIS — G4730 Sleep apnea, unspecified: Secondary | ICD-10-CM | POA: Diagnosis not present

## 2024-04-27 DIAGNOSIS — F32A Depression, unspecified: Secondary | ICD-10-CM | POA: Diagnosis not present

## 2024-04-27 DIAGNOSIS — R1033 Periumbilical pain: Secondary | ICD-10-CM | POA: Insufficient documentation

## 2024-04-27 DIAGNOSIS — Z5986 Financial insecurity: Secondary | ICD-10-CM | POA: Diagnosis not present

## 2024-04-27 DIAGNOSIS — I10 Essential (primary) hypertension: Secondary | ICD-10-CM | POA: Insufficient documentation

## 2024-04-27 HISTORY — PX: COLONOSCOPY: SHX5424

## 2024-04-27 HISTORY — PX: POLYPECTOMY: SHX149

## 2024-04-27 SURGERY — COLONOSCOPY
Anesthesia: General

## 2024-04-27 MED ORDER — PROPOFOL 10 MG/ML IV BOLUS
INTRAVENOUS | Status: DC | PRN
  Administered 2024-04-27 (×2): 80 mg via INTRAVENOUS

## 2024-04-27 MED ORDER — FENTANYL CITRATE (PF) 100 MCG/2ML IJ SOLN
INTRAMUSCULAR | Status: DC | PRN
  Administered 2024-04-27 (×4): 50 ug via INTRAVENOUS

## 2024-04-27 MED ORDER — FENTANYL CITRATE (PF) 100 MCG/2ML IJ SOLN
INTRAMUSCULAR | Status: AC
  Filled 2024-04-27: qty 2

## 2024-04-27 MED ORDER — PROPOFOL 500 MG/50ML IV EMUL
INTRAVENOUS | Status: DC | PRN
  Administered 2024-04-27 (×2): 100 ug/kg/min via INTRAVENOUS

## 2024-04-27 MED ORDER — SODIUM CHLORIDE 0.9 % IV SOLN
INTRAVENOUS | Status: DC
  Administered 2024-04-27 (×2): 500 mL via INTRAVENOUS

## 2024-04-27 NOTE — Anesthesia Postprocedure Evaluation (Signed)
 Anesthesia Post Note  Patient: Lacey Ramirez  Procedure(s) Performed: COLONOSCOPY POLYPECTOMY, INTESTINE  Patient location during evaluation: Endoscopy Anesthesia Type: General Level of consciousness: awake and alert Pain management: pain level controlled Vital Signs Assessment: post-procedure vital signs reviewed and stable Respiratory status: spontaneous breathing, nonlabored ventilation, respiratory function stable and patient connected to nasal cannula oxygen Cardiovascular status: blood pressure returned to baseline and stable Postop Assessment: no apparent nausea or vomiting Anesthetic complications: no   No notable events documented.   Last Vitals:  Vitals:   04/27/24 0829 04/27/24 0839  BP: 112/70 104/61  Pulse: 79   Resp: 19 (!) 24  Temp:    SpO2: 100% 100%    Last Pain:  Vitals:   04/27/24 0839  TempSrc:   PainSc: 0-No pain                 [name]

## 2024-04-27 NOTE — Transfer of Care (Addendum)
 Immediate Anesthesia Transfer of Care Note  Patient: Lacey Ramirez  Procedure(s) Performed: COLONOSCOPY POLYPECTOMY, INTESTINE  Patient Location: PACU and Endoscopy Unit  Anesthesia Type:MAC  Level of Consciousness: drowsy  Airway & Oxygen Therapy: Patient Spontanous Breathing  Post-op Assessment: Report given to RN and Post -op Vital signs reviewed and stable  Post vital signs: Reviewed and stable  Last Vitals:  Vitals Value Taken Time  BP    Temp    Pulse 81 04/27/24 08:18  Resp 16 04/27/24 08:18  SpO2 98 % 04/27/24 08:18  Vitals shown include unfiled device data.  Last Pain:  Vitals:   04/27/24 0713  TempSrc: Temporal  PainSc: 0-No pain         Complications: No notable events documented.

## 2024-04-27 NOTE — H&P (Signed)
 Ruel Kung , MD [address], [place], [place], [address] Phone: [phone] Fax: [phone]  Primary Care Physician:  Sampson Ethridge LABOR, MD   Pre-Procedure History & Physical: HPI:  Lacey Ramirez is a 63 y.o. female is here for an colonoscopy.   Past Medical History:  Diagnosis Date   ADHD (attention deficit hyperactivity disorder)    Anemia    Depression    GERD (gastroesophageal reflux disease)    Headache    Hypertension     Past Surgical History:  Procedure Laterality Date   ABDOMINAL HYSTERECTOMY     COLONOSCOPY WITH PROPOFOL  N/A 11/01/2018   Procedure: COLONOSCOPY WITH PROPOFOL ;  Surgeon: Unk Corinn Skiff, MD;  Location: [facility] ENDOSCOPY;  Service: Gastroenterology;  Laterality: N/A;   ESOPHAGOGASTRODUODENOSCOPY (EGD) WITH PROPOFOL  N/A 11/01/2018   Procedure: ESOPHAGOGASTRODUODENOSCOPY (EGD) WITH PROPOFOL ;  Surgeon: Unk Corinn Skiff, MD;  Location: [facility] ENDOSCOPY;  Service: Gastroenterology;  Laterality: N/A;    Prior to Admission medications   Medication Sig Start Date End Date Taking? Authorizing Provider  allopurinol (ZYLOPRIM) 300 MG tablet Take 300 mg by mouth daily.   Yes [provider]  atorvastatin (LIPITOR) 10 MG tablet SMARTSIG:1.0 Tablet(s) By Mouth Daily 07/21/23  Yes [provider]  baclofen (LIORESAL) 10 MG tablet Take 10 mg by mouth daily as needed.   Yes [provider]  buPROPion  (WELLBUTRIN  XL) 300 MG 24 hr tablet Take 1 tablet (300 mg total) by mouth daily. 11/19/23 05/17/24 Yes Hisada, Reina, MD  Cetirizine HCl 10 MG CAPS Take 10 mg by mouth daily.   Yes [provider]  cyclobenzaprine  (FLEXERIL ) 10 MG tablet Take 1 tablet (10 mg total) by mouth 3 (three) times daily as needed for muscle spasms. Patient taking differently: Take 10 mg by mouth at bedtime as needed for  muscle spasms. 05/23/17  Yes Jacquetta Stabs, MD  fluocinonide (LIDEX) 0.05 % external solution USE ON SCALP ONCE OR TWICE DAILY UNTIL CLEAR, THEN THREE DAYS A WEEK 02/21/23  Yes [provider]  hydrochlorothiazide (MICROZIDE) 12.5 MG capsule Take 12.5 mg by mouth daily.   Yes [provider]  hydrOXYzine  (ATARAX ) 25 MG tablet Take 1 tablet (25 mg total) by mouth daily as needed for anxiety. 11/23/23  Yes Bahraini, Lauraine A  phentermine 15 MG capsule Take 15 mg by mouth every morning.   Yes [provider]  sertraline  (ZOLOFT ) 100 MG tablet Take 2 tablets (200 mg total) by mouth at bedtime. 04/16/24 08/14/24 Yes Vickey Mettle, MD  gabapentin (NEURONTIN) 600 MG tablet Take 600 mg by mouth 3 (three) times daily. Patient not taking: Reported on 04/27/2024    [provider]  liver oil-zinc oxide (DESITIN) 40 % ointment Apply 1 Application topically as needed for irritation. Patient not taking: Reported on 04/27/2024    [provider]  omeprazole  (PRILOSEC) 40 MG capsule Take 1 capsule (40 mg total) by mouth 2 (two) times daily before a meal for 30 days. 10/12/18 08/12/23  Unk Corinn Skiff, MD    Allergies as of 04/18/2024   (No Known Allergies)    Family History  Problem Relation Age of Onset   Breast cancer Paternal Aunt    Bipolar disorder Maternal Uncle    Breast cancer Maternal Grandmother    Breast cancer Paternal Grandmother    Schizophrenia Cousin     Social History   Socioeconomic History   Marital status: Widowed    Spouse name: Not on file  Number of children: 2   Years of education: Not on file   Highest education level: 12th grade  Occupational History   Not on file  Tobacco Use   Smoking status: Never   Smokeless tobacco: Never  Vaping Use   Vaping status: Never Used  Substance and Sexual Activity   Alcohol use: Not Currently    Comment: occasional   Drug use: No   Sexual activity: Yes    Birth control/protection: None   Other Topics Concern   Not on file  Social History Narrative   Not on file   Social Drivers of Health   Financial Resource Strain: Medium Risk (10/28/2023)   Overall Financial Resource Strain (CARDIA)    Difficulty of Paying Living Expenses: Somewhat hard  Food Insecurity: No Food Insecurity (10/28/2023)   Hunger Vital Sign    Worried About Running Out of Food in the Last Year: Never true    Ran Out of Food in the Last Year: Never true  Transportation Needs: No Transportation Needs (10/28/2023)   PRAPARE - Administrator, Civil Service (Medical): No    Lack of Transportation (Non-Medical): No  Physical Activity: Inactive (10/28/2023)   Exercise Vital Sign    Days of Exercise per Week: 0 days    Minutes of Exercise per Session: 0 min  Stress: Stress Concern Present (10/28/2023)   [employer] of Occupational Health - Occupational Stress Questionnaire    Feeling of Stress : Rather much  Social Connections: Moderately Isolated (10/28/2023)   Social Connection and Isolation Panel    Frequency of Communication with Friends and Family: Once a week    Frequency of Social Gatherings with Friends and Family: Never    Attends Religious Services: More than 4 times per year    Active Member of [employer] or Organizations: No    Attends Banker Meetings: Never    Marital Status: Living with partner  Intimate Partner Violence: Not At Risk (10/28/2023)   Humiliation, Afraid, Rape, and Kick questionnaire    Fear of Current or Ex-Partner: No    Emotionally Abused: No    Physically Abused: No    Sexually Abused: No    Review of Systems: See HPI, otherwise negative ROS  Physical Exam: BP 111/69   Pulse 77   Temp (!) 96.9 F (36.1 C) (Temporal)   Resp 18   Ht 4' 8 (1.422 m)   Wt 83 kg   SpO2 100%   BMI 41.03 kg/m  General:   Alert,  pleasant and cooperative in NAD Head:  Normocephalic and atraumatic. Neck:  Supple; no masses or thyromegaly. Lungs:  Clear  throughout to auscultation, normal respiratory effort.    Heart:  +S1, +S2, Regular rate and rhythm, No edema. Abdomen:  Soft, nontender and nondistended. Normal bowel sounds, without guarding, and without rebound.   Neurologic:  Alert and  oriented x4;  grossly normal neurologically.  Impression/Plan: Lacey Ramirez is here for an colonoscopy to be performed for rectal bleeding.   Risks, benefits, limitations, and alternatives regarding  colonoscopy have been reviewed with the patient.  Questions have been answered.  All parties agreeable.   Ruel Kung, MD  04/27/2024, 7:53 AM

## 2024-04-27 NOTE — Anesthesia Preprocedure Evaluation (Signed)
 Anesthesia Evaluation  Patient identified by MRN, date of birth, ID band Patient awake    Reviewed: Allergy & Precautions, NPO status , Patient's Chart, lab work & pertinent test results  History of Anesthesia Complications Negative for: history of anesthetic complications  Airway Mallampati: III  TM Distance: >3 FB Neck ROM: full    Dental no notable dental hx.    Pulmonary sleep apnea    Pulmonary exam normal        Cardiovascular hypertension, On Medications negative cardio ROS Normal cardiovascular exam     Neuro/Psych  Headaches PSYCHIATRIC DISORDERS  Depression     Neuromuscular disease    GI/Hepatic Neg liver ROS,GERD  ,,  Endo/Other  negative endocrine ROS    Renal/GU negative Renal ROS  negative genitourinary   Musculoskeletal   Abdominal   Peds  Hematology  (+) Blood dyscrasia, anemia   Anesthesia Other Findings Past Medical History: No date: ADHD (attention deficit hyperactivity disorder) No date: Anemia No date: Depression No date: GERD (gastroesophageal reflux disease) No date: Headache No date: Hypertension  Past Surgical History: No date: ABDOMINAL HYSTERECTOMY 11/01/2018: COLONOSCOPY WITH PROPOFOL ; N/A     Comment:  Procedure: COLONOSCOPY WITH PROPOFOL ;  Surgeon: Unk Corinn Skiff, MD;  Location: [facility] ENDOSCOPY;  Service:               Gastroenterology;  Laterality: N/A; 11/01/2018: ESOPHAGOGASTRODUODENOSCOPY (EGD) WITH PROPOFOL ; N/A     Comment:  Procedure: ESOPHAGOGASTRODUODENOSCOPY (EGD) WITH               PROPOFOL ;  Surgeon: Unk Corinn Skiff, MD;  Location:               [facility] ENDOSCOPY;  Service: Gastroenterology;  Laterality:               N/A;  BMI    Body Mass Index: 41.03 kg/m      Reproductive/Obstetrics negative OB ROS                              Anesthesia Physical Anesthesia Plan  ASA: 3  Anesthesia Plan: General    Post-op Pain Management: Minimal or no pain anticipated   Induction: Intravenous  PONV Risk Score and Plan: 2 and Propofol  infusion and TIVA  Airway Management Planned: Natural Airway and Nasal Cannula  Additional Equipment:   Intra-op Plan:   Post-operative Plan:   Informed Consent: I have reviewed the patients History and Physical, chart, labs and discussed the procedure including the risks, benefits and alternatives for the proposed anesthesia with the patient or authorized representative who has indicated his/her understanding and acceptance.     Dental Advisory Given  Plan Discussed with: Anesthesiologist, CRNA and Surgeon  Anesthesia Plan Comments: (Patient consented for risks of anesthesia including but not limited to:  - adverse reactions to medications - risk of airway placement if required - damage to eyes, teeth, lips or other oral mucosa - nerve damage due to positioning  - sore throat or hoarseness - Damage to heart, brain, nerves, lungs, other parts of body or loss of life  Patient voiced understanding and assent.)         Anesthesia Quick Evaluation

## 2024-04-27 NOTE — Op Note (Signed)
 [facility] Gastroenterology Patient Name: Lacey Ramirez Procedure Date: 04/27/2024 7:10 AM MRN: [id number] Account #: [id number] Date of Birth: March 31, 1961 Admit Type: Outpatient Age: 63 Room: [facility] ENDO ROOM 3 Gender: Female Note Status: Supervisor Override Instrument Name: Colon Scope [id number] Procedure:             Colonoscopy Indications:           Periumbilical abdominal pain, Rectal bleeding,                         Follow-up for history of adenomatous polyps in the                         colon Providers:             Ruel Kung MD, MD Referring MD:          Ethridge Matsu Medicines:             Monitored Anesthesia Care Complications:         No immediate complications. Procedure:             Pre-Anesthesia Assessment:                        - Prior to the procedure, a History and Physical was                         performed, and patient medications, allergies and                         sensitivities were reviewed. The patient's tolerance                         of previous anesthesia was reviewed.                        - The risks and benefits of the procedure and the                         sedation options and risks were discussed with the                         patient. All questions were answered and informed                         consent was obtained.                        - ASA Grade Assessment: II - A patient with mild                         systemic disease.                        After obtaining informed consent, the colonoscope was                         passed under direct vision. Throughout the procedure,                         the patient's blood pressure, pulse, and oxygen  saturations were monitored continuously. The                         Colonoscope was introduced through the anus and                         advanced to the the cecum, identified by the                         appendiceal orifice.  The colonoscopy was performed                         with ease. The patient tolerated the procedure well.                         The quality of the bowel preparation was good. The                         ileocecal valve, appendiceal orifice, and rectum were                         photographed. Findings:      The perianal and digital rectal examinations were normal.      Multiple medium-mouthed diverticula were found in the sigmoid colon.      Non-bleeding internal hemorrhoids were found during retroflexion. The       hemorrhoids were medium-sized and Grade I (internal hemorrhoids that do       not prolapse).      A 6 mm polyp was found in the descending colon. The polyp was sessile.       The polyp was removed with a cold snare. Resection and retrieval were       complete.      A 3 mm polyp was found in the cecum. The polyp was sessile. The polyp       was removed with a jumbo cold forceps. Resection and retrieval were       complete.      The exam was otherwise without abnormality on direct and retroflexion       views. Impression:            - Diverticulosis in the sigmoid colon.                        - Non-bleeding internal hemorrhoids.                        - One 6 mm polyp in the descending colon, removed with                         a cold snare. Resected and retrieved.                        - One 3 mm polyp in the cecum, removed with a jumbo                         cold forceps. Resected and retrieved.                        - The examination was otherwise normal on direct and  retroflexion views. Recommendation:        - Discharge patient to home (with escort).                        - Resume previous diet.                        - Continue present medications.                        - Await pathology results.                        - Repeat colonoscopy for surveillance based on                         pathology results. Procedure Code(s):     ---  Professional ---                        [id number], Colonoscopy, flexible; with removal of                         tumor(s), polyp(s), or other lesion(s) by snare                         technique                        45380, 59, Colonoscopy, flexible; with biopsy, single                         or multiple Diagnosis Code(s):     --- Professional ---                        K64.0, First degree hemorrhoids                        D12.4, Benign neoplasm of descending colon                        D12.0, Benign neoplasm of cecum                        K62.5, Hemorrhage of anus and rectum                        K57.30, Diverticulosis of large intestine without                         perforation or abscess without bleeding CPT copyright 2022 American Medical Association. All rights reserved. The codes documented in this report are preliminary and upon coder review may  be revised to meet current compliance requirements. Ruel Kung, MD Ruel Kung MD, MD 04/27/2024 8:15:34 AM This report has been signed electronically. Number of Addenda: 0 Note Initiated On: 04/27/2024 7:10 AM Scope Withdrawal Time: 0 hours 9 minutes 53 seconds  Total Procedure Duration: 0 hours 14 minutes 7 seconds  Estimated Blood Loss:  Estimated blood loss: none.      [facility]

## 2024-04-28 LAB — SURGICAL PATHOLOGY

## 2024-05-10 ENCOUNTER — Ambulatory Visit: Admitting: Professional Counselor

## 2024-05-11 ENCOUNTER — Ambulatory Visit: Admitting: Podiatry

## 2024-05-16 NOTE — Progress Notes (Addendum)
 Pt presents with rt breast pain, rt flank pain, rt ear pain, cold/hot flashes,  and headache  x 2 days

## 2024-05-18 ENCOUNTER — Other Ambulatory Visit: Payer: Self-pay | Admitting: Psychiatry
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
## 2024-05-25 ENCOUNTER — Ambulatory Visit (INDEPENDENT_AMBULATORY_CARE_PROVIDER_SITE_OTHER): Admitting: Podiatry

## 2024-05-25 DIAGNOSIS — L603 Nail dystrophy: Secondary | ICD-10-CM | POA: Diagnosis not present

## 2024-05-25 NOTE — Progress Notes (Signed)
 Subjective:  Patient ID: Lacey Ramirez, female    DOB: 14-Apr-1961,  MRN: [id number] HPI  Chief complaint of painful elongated toenails they have been like this for many years and like to see if there is anything that can be done about it 63 y.o. female presents with the above complaint.   ROS: Denies fever chills no joint muscle aches pains calf pain back pain chest pain shortness of breath.  Past Medical History:  Diagnosis Date   ADHD (attention deficit hyperactivity disorder)    Anemia    Depression    GERD (gastroesophageal reflux disease)    Headache    Hypertension    Past Surgical History:  Procedure Laterality Date   ABDOMINAL HYSTERECTOMY     COLONOSCOPY N/A 04/27/2024   Procedure: COLONOSCOPY;  Surgeon: Therisa Bi, MD;  Location: [facility] ENDOSCOPY;  Service: Gastroenterology;  Laterality: N/A;   COLONOSCOPY WITH PROPOFOL  N/A 11/01/2018   Procedure: COLONOSCOPY WITH PROPOFOL ;  Surgeon: Unk Corinn Skiff, MD;  Location: [facility] ENDOSCOPY;  Service: Gastroenterology;  Laterality: N/A;   ESOPHAGOGASTRODUODENOSCOPY (EGD) WITH PROPOFOL  N/A 11/01/2018   Procedure: ESOPHAGOGASTRODUODENOSCOPY (EGD) WITH PROPOFOL ;  Surgeon: Unk Corinn Skiff, MD;  Location: [facility] ENDOSCOPY;  Service: Gastroenterology;  Laterality: N/A;   POLYPECTOMY  04/27/2024   Procedure: POLYPECTOMY, INTESTINE;  Surgeon: Therisa Bi, MD;  Location: [facility] ENDOSCOPY;  Service: Gastroenterology;;    Current Outpatient Medications:    albuterol (VENTOLIN HFA) 108 (90 Base) MCG/ACT inhaler, Inhale 2 puffs into the lungs., Disp: , Rfl:    amoxicillin-clavulanate (AUGMENTIN) 875-125 MG tablet, Take 1 tablet by mouth., Disp: , Rfl:    aspirin EC 81 MG tablet, 1 (one) time each day at the same time., Disp: , Rfl:    azithromycin (ZITHROMAX) 250 MG tablet, Take 2 tabs on day 1 then d 1 tab on day 2-5, Disp: , Rfl:    benzonatate (TESSALON) 200 MG capsule, Take 200 mg by mouth 3 (three) times daily as needed., Disp: , Rfl:     DULoxetine (CYMBALTA) 30 MG capsule, 1 capsule., Disp: , Rfl:    famotidine  (PEPCID ) 20 MG tablet, Take 20 mg by mouth., Disp: , Rfl:    fexofenadine (ALLEGRA) 180 MG tablet, 1 (one) time each day at the same time., Disp: , Rfl:    fluconazole (DIFLUCAN) 150 MG tablet, TAKE 1 TABLET BY MOUTH EVERY 3 DAYS FOR 3 DOSES FOR 9 DAYS, Disp: , Rfl:    fluticasone (FLONASE) 50 MCG/ACT nasal spray, Place 2 sprays into the nose., Disp: , Rfl:    ibuprofen  (ADVIL ) 800 MG tablet, every 8 (eight) hours., Disp: , Rfl:    meloxicam (MOBIC) 7.5 MG tablet, TAKE 1 TABLET BY MOUTH ONCE DAILY WITH MEALS, Disp: , Rfl:    tirzepatide (MOUNJARO) 2.5 MG/0.5ML Pen, Inject 2.5 mg Subcutaneous once a week for 28 days for 4 weeks for obesity related complications including obstructive sleep apnea, metabolic syndrome, Disp: , Rfl:    allopurinol (ZYLOPRIM) 300 MG tablet, Take 300 mg by mouth daily., Disp: , Rfl:    Aspirin (VAZALORE) 81 MG CAPS, Take 1 capsule by mouth., Disp: , Rfl:    atorvastatin (LIPITOR) 10 MG tablet, SMARTSIG:1.0 Tablet(s) By Mouth Daily, Disp: , Rfl:    baclofen (LIORESAL) 10 MG tablet, Take 10 mg by mouth daily as needed., Disp: , Rfl:    buPROPion  (WELLBUTRIN  XL) 300 MG 24 hr tablet, Take 1 tablet (300 mg total) by mouth daily., Disp: 30 tablet, Rfl: 5   Cetirizine HCl  10 MG CAPS, Take 10 mg by mouth daily., Disp: , Rfl:    cyclobenzaprine  (FLEXERIL ) 10 MG tablet, Take 1 tablet (10 mg total) by mouth 3 (three) times daily as needed for muscle spasms. (Patient taking differently: Take 10 mg by mouth at bedtime as needed for muscle spasms.), Disp: 21 tablet, Rfl: 0   donepezil (ARICEPT) 5 MG tablet, Take 5 mg by mouth daily., Disp: , Rfl:    fluocinonide (LIDEX) 0.05 % external solution, USE ON SCALP ONCE OR TWICE DAILY UNTIL CLEAR, THEN THREE DAYS A WEEK, Disp: , Rfl:    FLUoxetine  (PROZAC ) 40 MG capsule, Take 40 mg by mouth., Disp: , Rfl:    gabapentin (NEURONTIN) 600 MG tablet, Take 600 mg by  mouth 3 (three) times daily. (Patient not taking: Reported on 04/27/2024), Disp: , Rfl:    hydrochlorothiazide (MICROZIDE) 12.5 MG capsule, Take 12.5 mg by mouth daily., Disp: , Rfl:    hydrOXYzine  (ATARAX ) 25 MG tablet, Take 1 tablet (25 mg total) by mouth daily as needed for anxiety., Disp: 30 tablet, Rfl: 1   ketoconazole (NIZORAL) 2 % shampoo, SHAMPOO SCALP THREE TIMES A WEEK, LATHER, LEAVE ON FOR 5 MINUTES, THEN RINSE WELL. USE THIS SHAMPOO AS MAINTENANCE., Disp: , Rfl:    liver oil-zinc oxide (DESITIN) 40 % ointment, Apply 1 Application topically as needed for irritation. (Patient not taking: Reported on 04/27/2024), Disp: , Rfl:    omeprazole  (PRILOSEC) 40 MG capsule, Take 1 capsule (40 mg total) by mouth 2 (two) times daily before a meal for 30 days., Disp: 60 capsule, Rfl: 0   phentermine 15 MG capsule, Take 15 mg by mouth every morning., Disp: , Rfl:    sertraline  (ZOLOFT ) 100 MG tablet, Take 2 tablets (200 mg total) by mouth at bedtime., Disp: 60 tablet, Rfl: 3  No Known Allergies Review of Systems Objective:  There were no vitals filed for this visit.  General: Well developed, nourished, in no acute distress, alert and oriented x3   Dermatological: Skin is warm, dry and supple bilateral. Nails x 10 are thick discolored dystrophic subungual debris elongated and painful remaining integument appears unremarkable at this time. There are no open sores, no preulcerative lesions, no rash or signs of infection present.  Vascular: Dorsalis Pedis artery and Posterior Tibial artery pedal pulses are 2/4 bilateral with immedate capillary fill time. Pedal hair growth present. No varicosities and no lower extremity edema present bilateral.   Neruologic: Grossly intact via light touch bilateral. Vibratory intact via tuning fork bilateral. Protective threshold with Semmes Wienstein monofilament intact to all pedal sites bilateral. Patellar and Achilles deep tendon reflexes 2+ bilateral. No Babinski or  clonus noted bilateral.   Musculoskeletal: No gross boney pedal deformities bilateral. No pain, crepitus, or limitation noted with foot and ankle range of motion bilateral. Muscular strength 5/5 in all groups tested bilateral.  Gait: Unassisted, Nonantalgic.    Radiographs:  None taken  Assessment & Plan:   Assessment: Nail dystrophy most likely onychomycosis  Plan: Samples of skin and nail were taken today for pathologic evaluation follow-up with her in 30 days     Mayur Duman T. [place], [place]

## 2024-05-26 ENCOUNTER — Other Ambulatory Visit: Payer: Self-pay

## 2024-05-26 ENCOUNTER — Ambulatory Visit (INDEPENDENT_AMBULATORY_CARE_PROVIDER_SITE_OTHER): Admitting: Psychiatry

## 2024-05-26 ENCOUNTER — Encounter: Payer: Self-pay | Admitting: Psychiatry

## 2024-05-26 VITALS — BP 143/83 | HR 92 | Temp 96.9°F | Ht <= 58 in | Wt 190.2 lb

## 2024-05-26 DIAGNOSIS — F4310 Post-traumatic stress disorder, unspecified: Secondary | ICD-10-CM | POA: Diagnosis not present

## 2024-05-26 DIAGNOSIS — F3341 Major depressive disorder, recurrent, in partial remission: Secondary | ICD-10-CM

## 2024-05-26 MED ORDER — BUPROPION HCL ER (XL) 300 MG PO TB24: 300.0000 mg | ORAL_TABLET | Freq: Every day | ORAL | 5 refills | Status: AC

## 2024-05-26 NOTE — Patient Instructions (Signed)
 Continue sertraline  200 mg at night  Continue bupropion  300 mg daily  Next appointment: 11/11 at 2:30

## 2024-05-30 ENCOUNTER — Other Ambulatory Visit: Payer: Self-pay | Admitting: Podiatry

## 2024-06-16 ENCOUNTER — Encounter

## 2024-06-22 ENCOUNTER — Ambulatory Visit: Admitting: Podiatry

## 2024-06-22 DIAGNOSIS — B351 Tinea unguium: Secondary | ICD-10-CM | POA: Diagnosis not present

## 2024-06-22 DIAGNOSIS — L603 Nail dystrophy: Secondary | ICD-10-CM | POA: Diagnosis not present

## 2024-06-22 MED ORDER — TERBINAFINE HCL 250 MG PO TABS
250.0000 mg | ORAL_TABLET | Freq: Every day | ORAL | 0 refills | Status: DC
Start: 2024-06-22 — End: 2024-07-18

## 2024-06-22 NOTE — Progress Notes (Signed)
 She presents today for follow-up of her nail pathology.  She states that there have been no changes.  Objective: Vital signs are stable alert and oriented x 3.  Pulses are palpable.  Recent blood work does demonstrate good liver profile.  Nail pathology does demonstrate nail dystrophy 2 types of yeast.  And 1 dermatophyte.  Assessment: Onychomycosis with nail dystrophy and bilateral foot.  Plan: Discussed the pros and cons of oral therapy.  I expressed to her that this would most likely give her the best result.  She understands this and is amenable to it does understand the possible side effects and complications associated with this drug as well as the necessity for continued blood work.  I dispensed a prescription for terbinafine 250 mg tablets.  30 tablets were dispensed 0 refills.  She will take 1 tablet daily by mouth and follow-up with me in 1 month.

## 2024-07-18 ENCOUNTER — Other Ambulatory Visit: Payer: Self-pay

## 2024-07-18 ENCOUNTER — Ambulatory Visit (INDEPENDENT_AMBULATORY_CARE_PROVIDER_SITE_OTHER): Admitting: Podiatry

## 2024-07-18 DIAGNOSIS — Z79899 Other long term (current) drug therapy: Secondary | ICD-10-CM | POA: Diagnosis not present

## 2024-07-18 MED ORDER — TERBINAFINE HCL 250 MG PO TABS: 250.0000 mg | ORAL_TABLET | Freq: Every day | ORAL | 0 refills | Status: AC

## 2024-07-18 NOTE — Progress Notes (Signed)
 She presents today for follow-up of her Lamisil.  She states that she has developed a slight itchy rash that appears to be healing.  But otherwise had no problems taking her first 30 days of medication.  Objective: No change in her toenails as of yet though her feet are starting to clear from the tinea pedis.  Assessment: Long-term therapy with Lamisil for onychomycosis and tinea pedis.  Plan: We will start her on her next 90 days of tablets I will follow-up with her in 4 months.  We are going to request a comprehensive metabolic panel should this come back abnormal I will notify her.

## 2024-07-22 LAB — COMPREHENSIVE METABOLIC PANEL WITH GFR
ALT: 19 IU/L (ref 0–32)
AST: 18 IU/L (ref 0–40)
Albumin: 4.4 g/dL (ref 3.9–4.9)
Alkaline Phosphatase: 78 IU/L (ref 49–135)
BUN/Creatinine Ratio: 16 (ref 12–28)
BUN: 18 mg/dL (ref 8–27)
Bilirubin Total: 0.3 mg/dL (ref 0.0–1.2)
CO2: 22 mmol/L (ref 20–29)
Calcium: 10 mg/dL (ref 8.7–10.3)
Chloride: 102 mmol/L (ref 96–106)
Creatinine, Ser: 1.1 mg/dL — ABNORMAL HIGH (ref 0.57–1.00)
Globulin, Total: 2.9 g/dL (ref 1.5–4.5)
Glucose: 91 mg/dL (ref 70–99)
Potassium: 4.7 mmol/L (ref 3.5–5.2)
Sodium: 140 mmol/L (ref 134–144)
Total Protein: 7.3 g/dL (ref 6.0–8.5)
eGFR: 57 mL/min/1.73 — ABNORMAL LOW (ref >59)
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
## 2024-07-26 ENCOUNTER — Encounter: Payer: Self-pay | Admitting: Psychiatry

## 2024-07-26 ENCOUNTER — Ambulatory Visit (INDEPENDENT_AMBULATORY_CARE_PROVIDER_SITE_OTHER): Admitting: Psychiatry

## 2024-07-26 ENCOUNTER — Other Ambulatory Visit: Payer: Self-pay

## 2024-07-26 VITALS — BP 119/82 | HR 90 | Temp 97.4°F | Ht <= 58 in | Wt 186.6 lb

## 2024-07-26 DIAGNOSIS — F4310 Post-traumatic stress disorder, unspecified: Secondary | ICD-10-CM | POA: Diagnosis not present

## 2024-07-26 DIAGNOSIS — F330 Major depressive disorder, recurrent, mild: Secondary | ICD-10-CM

## 2024-07-26 MED ORDER — BUPROPION HCL ER (XL) 150 MG PO TB24: 150.0000 mg | ORAL_TABLET | Freq: Every day | ORAL | 1 refills | Status: DC

## 2024-07-26 MED ORDER — SERTRALINE HCL 100 MG PO TABS: 200.0000 mg | ORAL_TABLET | Freq: Every day | ORAL | 3 refills | Status: AC

## 2024-07-26 NOTE — Patient Instructions (Signed)
 Continue sertraline  200 mg at night   Increase bupropion  450 mg daily  Next appointment: 1/8 at 3 pm

## 2024-07-28 ENCOUNTER — Other Ambulatory Visit: Payer: Self-pay | Admitting: Internal Medicine

## 2024-07-28 ENCOUNTER — Ambulatory Visit
Admission: RE | Admit: 2024-07-28 | Discharge: 2024-07-28 | Disposition: A | Source: Ambulatory Visit | Attending: Internal Medicine

## 2024-07-28 ENCOUNTER — Ambulatory Visit
Admission: RE | Admit: 2024-07-28 | Discharge: 2024-07-28 | Disposition: A | Discharge status: Home / Self Care | Attending: Internal Medicine | Admitting: Internal Medicine

## 2024-07-28 DIAGNOSIS — M79632 Pain in left forearm: Secondary | ICD-10-CM | POA: Insufficient documentation

## 2024-08-02 ENCOUNTER — Ambulatory Visit: Payer: Self-pay | Admitting: Podiatry

## 2024-08-02 NOTE — Progress Notes (Signed)
 Patient informed of lab results and recommendations

## 2024-08-16 ENCOUNTER — Encounter: Payer: Self-pay | Admitting: Physical Therapy

## 2024-08-16 ENCOUNTER — Ambulatory Visit: Admitting: Physical Therapy

## 2024-08-16 DIAGNOSIS — M6281 Muscle weakness (generalized): Secondary | ICD-10-CM | POA: Diagnosis present

## 2024-08-16 DIAGNOSIS — M5459 Other low back pain: Secondary | ICD-10-CM | POA: Diagnosis present

## 2024-08-16 NOTE — Therapy (Signed)
 OUTPATIENT PHYSICAL THERAPY THORACOLUMBAR EVALUATION/TREATMENT   Patient Name: Lacey Ramirez MRN: [id number] DOB:03-17-1961, 63 y.o., female Today's Date: 08/16/2024  END OF SESSION:  PT End of Session - 08/16/24 1447     Visit Number 1    Number of Visits 17    Date for Recertification  10/11/24    PT Start Time 1450    PT Stop Time 1535    PT Time Calculation (min) 45 min    Activity Tolerance Patient tolerated treatment well    Behavior During Therapy WFL for tasks assessed/performed          Past Medical History:  Diagnosis Date   ADHD (attention deficit hyperactivity disorder)    Anemia    Depression    GERD (gastroesophageal reflux disease)    Headache    Hypertension    Past Surgical History:  Procedure Laterality Date   ABDOMINAL HYSTERECTOMY     COLONOSCOPY N/A 04/27/2024   Procedure: COLONOSCOPY;  Surgeon: Therisa Bi, MD;  Location: [facility] ENDOSCOPY;  Service: Gastroenterology;  Laterality: N/A;   COLONOSCOPY WITH PROPOFOL  N/A 11/01/2018   Procedure: COLONOSCOPY WITH PROPOFOL ;  Surgeon: Unk Corinn Skiff, MD;  Location: [facility] ENDOSCOPY;  Service: Gastroenterology;  Laterality: N/A;   ESOPHAGOGASTRODUODENOSCOPY (EGD) WITH PROPOFOL  N/A 11/01/2018   Procedure: ESOPHAGOGASTRODUODENOSCOPY (EGD) WITH PROPOFOL ;  Surgeon: Unk Corinn Skiff, MD;  Location: [facility] ENDOSCOPY;  Service: Gastroenterology;  Laterality: N/A;   POLYPECTOMY  04/27/2024   Procedure: POLYPECTOMY, INTESTINE;  Surgeon: Therisa Bi, MD;  Location: [facility] ENDOSCOPY;  Service: Gastroenterology;;   Patient Active Problem List   Diagnosis Date Noted   OSA (obstructive sleep apnea) 06/05/2023   Obesity 06/05/2023    PCP: Sampson Ethridge LABOR, MD  REFERRING PROVIDER: Sampson Ethridge LABOR, MD  REFERRING DIAG:  [phone] (ICD-10-CM) - Spinal stenosis, lumbar region without neurogenic claudication  M54.16 (ICD-10-CM) - Radiculopathy, lumbar region    Rationale for Evaluation and Treatment:  Rehabilitation  THERAPY DIAG:  Other low back pain  Muscle weakness (generalized)  ONSET DATE: Chronic. Recent flare up in the last week  SUBJECTIVE:                                                                                                                                                                                           SUBJECTIVE STATEMENT: Pt is a pleasant 63 y.o. female reporting to OPPT for acute on chronic LBP with LLE radiculopathy.   PERTINENT HISTORY:  Pt reports B LBP with L sided radiculopathy. Describes S1 dermatome distribution with N/T to the dorsum of foot. Burning, pins and needles described. Pain similar in her lower back. Has instances  of LLE giving away with knee buckling randomly. 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 Pain worsens with standing. Improves with sitting and walking. N/T worsens with prolonged sitting in LLE after 30 minutes. Worst pain 7/10 NPS. Best pain 7/10 NPS. Reports having dizziness and unsteadiness recernyly too. Has onset of seeing stars but also describes dizziness with spinning sensation with head movements. Does report having COVID infection 2 months ago. No dizziness prior to COVID and denies head trauma.   PAIN:  Are you having pain? Yes: NPRS scale: 7/10 NPS Pain location: L5-S1 along lower back and into LLE S1 dermatome distribution Pain description: pins/needles, burning Aggravating factors: Standing, laying prone Relieving factors: sitting, walking   PRECAUTIONS: None  RED FLAGS: None   WEIGHT BEARING RESTRICTIONS: No  FALLS:  Has patient fallen in last 6 months? 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  DIAGNOSTIC FINDINGS:  N/A  PATIENT SURVEYS:  Modified Oswestry:  MODIFIED OSWESTRY DISABILITY SCALE  Date: 08/16/24  Score  Pain intensity 1 = The pain is bad, but I can manage without having to take pain medication  2. Personal care (washing, dressing, etc.) 1 =  I can take care of myself normally, but it increases my pain.  3. Lifting 4 = I can lift only very light weights  4. Walking 1 = Pain prevents me from walking more than 1 mile.  5. Sitting 4 =  Pain prevents me from sitting more than 10 minutes.  6. Standing 4 =  Pain prevents me from standing more than 10 minutes.  7. Sleeping 2 =  Even when I take pain medication, I sleep less than 6 hours  8. Social Life 4 =  Pain has restricted my social life to my home  9. Traveling 4 = My pain restricts my travel to short necessary journeys under 1/2 hour.  10. Employment/ Homemaking 4 = Pain prevents me from doing even light duties.  Total 29/50   Interpretation of scores: Score Category Description  0-20% Minimal Disability The patient can cope with most living activities. Usually no treatment is indicated apart from advice on lifting, sitting and exercise  21-40% Moderate Disability The patient experiences more pain and difficulty with sitting, lifting and standing. Travel and social life are more difficult and they may be disabled from work. Personal care, sexual activity and sleeping are not grossly affected, and the patient can usually be managed by conservative means  41-60% Severe Disability Pain remains the main problem in this group, but activities of daily living are affected. These patients require a detailed investigation  61-80% Crippled Back pain impinges on all aspects of the patient's life. Positive intervention is required  81-100% Bed-bound These patients are either bed-bound or exaggerating their symptoms  Bluford FORBES Zoe DELENA Karon DELENA, et al. Surgery versus conservative management of stable thoracolumbar fracture: the PRESTO feasibility RCT. [place] ([place]): [employer]; 2021 Nov. [facility] Technology Assessment, No. 25.62.)  Appendix 3, Oswestry Disability Index category descriptors. Available from: Findjewelers.cz  Minimally Clinically Important Difference (MCID) = 12.8%  COGNITION: Overall cognitive status: Within functional limits for tasks assessed     SENSATION: Light touch: Impaired Reports L2-S1 being impaired on LLE  MUSCLE LENGTH: Hamstrings: Right 90 deg; Left 90 deg   POSTURE: increased lumbar lordosis  PALPATION: TTP at L5-S1 paraspinals level and upper glutes bilat along with L sided piriformis  LUMBAR ROM:   AROM eval  Flexion 100%*  Extension 100%*  Right lateral flexion   Left lateral flexion   Right rotation 50%*  Left rotation 80%   (Blank rows = not tested)  LOWER EXTREMITY ROM:     Active  Right eval Left eval  Hip flexion    Hip extension    Hip abduction    Hip adduction    Hip internal rotation    Hip external rotation    Knee flexion    Knee extension    Ankle dorsiflexion    Ankle plantarflexion    Ankle inversion    Ankle eversion     (Blank rows = not tested)  LOWER EXTREMITY MMT:    MMT Right eval Left eval  Hip flexion 4 3+  Hip extension    Hip abduction 4 3+  Hip adduction    Hip internal rotation 3 3  Hip external rotation 4 4  Knee flexion 5 4  Knee extension 5 4  Ankle dorsiflexion 5 4  Ankle plantarflexion 5 5  Ankle inversion    Ankle eversion     (Blank rows = not tested)  LUMBAR SPECIAL TESTS:  Straight leg raise test: Negative and Slump test: Negative  FADDIR: positive LLE   FABER: positive LLE  FUNCTIONAL TESTS:  5 times sit to stand: 21.63 sec  GAIT: Distance walked: 10' Assistive device utilized: None Level of assistance: Complete Independence Comments: Normal gait  TREATMENT DATE: 08/16/24  Education and review of HEP provided. Discussed importance of f/u with PCP with subacute onset of dizziness and concerns for possible vestibular neuritis after subjective reports of viral  infection ~2 months ago.                                                                                                                   PATIENT EDUCATION:  Education details: See above Person educated: Patient Education method: Explanation, Demonstration, and Handouts Education comprehension: verbalized understanding, verbal cues required, tactile cues required, and needs further education  HOME EXERCISE PROGRAM: Access Code: [email or web address] URL: [email or web address] Date: 08/16/2024 Prepared by: Dorina Kingfisher  Exercises - Supine Lower Trunk Rotation  - 1 x daily - 7 x weekly - 2 sets - 12 reps - Supine Bridge  - 1 x daily - 7 x weekly - 2 sets - 12 reps - Supine Piriformis Stretch with Foot on Ground  - 1 x daily - 7 x weekly - 1 sets - 3 reps - 30 hold - Sit to Stand with Arms Crossed  - 1 x daily - 3 x weekly - 3 sets - 6 reps - Sidelying Hip Abduction  - 1 x daily - 3 x weekly - 3 sets - 6 reps  ASSESSMENT:  CLINICAL IMPRESSION: Patient is a 63 y.o. female who was seen today for physical therapy evaluation and treatment for acute on chronic LBP. Pt presents  with LLE weakness, impaired lumbar ROM, impaired LLE sensations and generalized weakness as evidenced by 5xSTS. Exam limited due to inability to lay prone to assess joint mobility. Pt scoring 29/50 on mODI indicative of severe disability due to her LBP leading to difficulty participating in household, work, and community ADL's. Pt will benefit from skilled PT services to address these impairments and maximize return to PLOF.   OBJECTIVE IMPAIRMENTS: decreased activity tolerance, decreased balance, decreased mobility, decreased ROM, decreased strength, dizziness, impaired flexibility, impaired sensation, improper body mechanics, postural dysfunction, obesity, and pain.   ACTIVITY LIMITATIONS: carrying, lifting, bending, standing, squatting, sleeping, bed mobility, locomotion level, and caring for others  PARTICIPATION  LIMITATIONS: community activity and occupation  PERSONAL FACTORS: Age, Education, Past/current experiences, Profession, Time since onset of injury/illness/exacerbation, and 3+ comorbidities: Anemia, HTN, depression are also affecting patient's functional outcome.   REHAB POTENTIAL: Fair Chronicity of symptoms  CLINICAL DECISION MAKING: Evolving/moderate complexity  EVALUATION COMPLEXITY: Moderate   GOALS: Goals reviewed with patient? No  SHORT TERM GOALS: Target date: 09/13/24  Pt will be independent with HEP to improve Lumbar ROM, LLE strength, and pain to improve ADL completion Baseline:08/16/24: initial HEP provided Goal status: INITIAL  LONG TERM GOALS: Target date: 10/11/24  Pt will improve mODI by at least 12.8% to demonstrate clinically significant reduction in disability from LBP.  Baseline: 08/16/24: 29/50= 58% Goal status: INITIAL  2.  Pt will improve 5xSTS to 11.4 sec or less to demonstrate age matched norms for LE strength. Baseline: 08/16/24: 21.63 seconds Goal status: INITIAL  3.  Pt will report worst pain as a 5/10 NPS or less with standing tasks to demonstrate clinically significant reduction in pain Baseline: 08/16/24: 7/10 NPS Goal status: INITIAL  PLAN:  PT FREQUENCY: 1-2x/week  PT DURATION: 8 weeks  PLANNED INTERVENTIONS: [id number]- PT Re-evaluation, 97750- Physical Performance Testing, 97110-Therapeutic exercises, 97530- Therapeutic activity, 97112- Neuromuscular re-education, 97535- Self Care, [id number]- Manual therapy, [phone]- Canalith repositioning, [id number]- Aquatic Therapy, [id number]- Electrical stimulation (unattended), [id number]- Electrical stimulation (manual), 20560 (1-2 muscles), 20561 (3+ muscles)- Dry Needling, Patient/Family education, Balance training, Stair training, Joint mobilization, Spinal mobilization, Vestibular training, Cryotherapy, and Moist heat.  PLAN FOR NEXT SESSION: DGI or FGA. Review HEP. Further lumbar testing if indicated. Core stability.     Dorina HERO. Fairly IV, PT, DPT Physical Therapist- [facility] 08/16/2024, 4:51 PM

## 2024-08-18 ENCOUNTER — Ambulatory Visit: Admitting: Physical Therapy

## 2024-08-18 ENCOUNTER — Encounter: Payer: Self-pay | Admitting: Physical Therapy

## 2024-08-18 DIAGNOSIS — M6281 Muscle weakness (generalized): Secondary | ICD-10-CM

## 2024-08-18 DIAGNOSIS — M5459 Other low back pain: Secondary | ICD-10-CM | POA: Diagnosis not present

## 2024-08-18 NOTE — Therapy (Signed)
 OUTPATIENT PHYSICAL THERAPY THORACOLUMBAR TREATMENT   Patient Name: Lacey Ramirez MRN: [id number] DOB:05-25-1961, 63 y.o., female Today's Date: 08/18/2024  END OF SESSION:  PT End of Session - 08/18/24 0946     Visit Number 2    Number of Visits 17    Date for Recertification  10/11/24    Authorization - Visit Number 2    Progress Note Due on Visit 10    PT Start Time 0945    PT Stop Time 1030    PT Time Calculation (min) 45 min    Activity Tolerance Patient tolerated treatment well    Behavior During Therapy WFL for tasks assessed/performed          Past Medical History:  Diagnosis Date   ADHD (attention deficit hyperactivity disorder)    Anemia    Depression    GERD (gastroesophageal reflux disease)    Headache    Hypertension    Past Surgical History:  Procedure Laterality Date   ABDOMINAL HYSTERECTOMY     COLONOSCOPY N/A 04/27/2024   Procedure: COLONOSCOPY;  Surgeon: Therisa Bi, MD;  Location: [facility] ENDOSCOPY;  Service: Gastroenterology;  Laterality: N/A;   COLONOSCOPY WITH PROPOFOL  N/A 11/01/2018   Procedure: COLONOSCOPY WITH PROPOFOL ;  Surgeon: Unk Corinn Skiff, MD;  Location: [facility] ENDOSCOPY;  Service: Gastroenterology;  Laterality: N/A;   ESOPHAGOGASTRODUODENOSCOPY (EGD) WITH PROPOFOL  N/A 11/01/2018   Procedure: ESOPHAGOGASTRODUODENOSCOPY (EGD) WITH PROPOFOL ;  Surgeon: Unk Corinn Skiff, MD;  Location: [facility] ENDOSCOPY;  Service: Gastroenterology;  Laterality: N/A;   POLYPECTOMY  04/27/2024   Procedure: POLYPECTOMY, INTESTINE;  Surgeon: Therisa Bi, MD;  Location: [facility] ENDOSCOPY;  Service: Gastroenterology;;   Patient Active Problem List   Diagnosis Date Noted   OSA (obstructive sleep apnea) 06/05/2023   Obesity 06/05/2023    PCP: Sampson Ethridge LABOR, MD  REFERRING PROVIDER: Sampson Ethridge LABOR, MD  REFERRING DIAG:  [phone] (ICD-10-CM) - Spinal stenosis, lumbar region without neurogenic claudication  M54.16 (ICD-10-CM) - Radiculopathy, lumbar  region    Rationale for Evaluation and Treatment: Rehabilitation  THERAPY DIAG:  Other low back pain  Muscle weakness (generalized)  ONSET DATE: Chronic. Recent flare up in the last week  SUBJECTIVE:                                                                                                                                                                                           SUBJECTIVE STATEMENT: Pt reports increased pain in low back at start of session and she continues to feel dizzy and to the point where she has to catch herself from falling. She also reports that her unsteadiness is associated with  her low back pain like she is freezing up. She reports visual distortions with color changes when she looked at sink that was blue and it should have been white.   PERTINENT HISTORY:  Pt reports B LBP with L sided radiculopathy. Describes S1 dermatome distribution with N/T to the dorsum of foot. Burning, pins and needles described. Pain similar in her lower back. Has instances of LLE giving away with knee buckling randomly. 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 Pain worsens with standing. Improves with sitting and walking. N/T worsens with prolonged sitting in LLE after 30 minutes. Worst pain 7/10 NPS. Best pain 7/10 NPS. Reports having dizziness and unsteadiness recernyly too. Has onset of seeing stars but also describes dizziness with spinning sensation with head movements. Does report having COVID infection 2 months ago. No dizziness prior to COVID and denies head trauma.   PAIN:  Are you having pain? Yes: NPRS scale: 8/10 NPS Pain location: L5-S1 along lower back and into LLE S1 dermatome distribution Pain description: pins/needles, burning Aggravating factors: Standing, laying prone Relieving factors: sitting, walking   PRECAUTIONS: None  RED FLAGS: None   WEIGHT BEARING RESTRICTIONS: No  FALLS:  Has patient fallen in last 6 months? 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  ORTHOSTATIC VITALS: Sitting      DIAGNOSTIC FINDINGS:  N/A  PATIENT SURVEYS:  Modified Oswestry:  MODIFIED OSWESTRY DISABILITY SCALE  Date: 08/16/24 Score  Pain intensity 1 = The pain is bad, but I can manage without having to take pain medication  2. Personal care (washing, dressing, etc.) 1 =  I can take care of myself normally, but it increases my pain.  3. Lifting 4 = I can lift only very light weights  4. Walking 1 = Pain prevents me from walking more than 1 mile.  5. Sitting 4 =  Pain prevents me from sitting more than 10 minutes.  6. Standing 4 =  Pain prevents me from standing more than 10 minutes.  7. Sleeping 2 =  Even when I take pain medication, I sleep less than 6 hours  8. Social Life 4 =  Pain has restricted my social life to my home  9. Traveling 4 = My pain restricts my travel to short necessary journeys under 1/2 hour.  10. Employment/ Homemaking 4 = Pain prevents me from doing even light duties.  Total 29/50   Interpretation of scores: Score Category Description  0-20% Minimal Disability The patient can cope with most living activities. Usually no treatment is indicated apart from advice on lifting, sitting and exercise  21-40% Moderate Disability The patient experiences more pain and difficulty with sitting, lifting and standing. Travel and social life are more difficult and they may be disabled from work. Personal care, sexual activity and sleeping are not grossly affected, and the patient can usually be managed by conservative means  41-60% Severe Disability Pain remains the main problem in this group, but activities of daily living are affected. These patients require a detailed investigation  61-80% Crippled Back pain impinges on all aspects of the patient's life.  Positive intervention is required  81-100% Bed-bound These patients are either bed-bound or exaggerating their symptoms  Bluford FORBES Zoe DELENA Karon DELENA, et al. Surgery versus conservative management of stable thoracolumbar fracture: the  PRESTO feasibility RCT. [place] ([place]): [employer]; 2021 Nov. [facility] Technology Assessment, No. 25.62.) Appendix 3, Oswestry Disability Index category descriptors. Available from: Findjewelers.cz  Minimally Clinically Important Difference (MCID) = 12.8%  COGNITION: Overall cognitive status: Within functional limits for tasks assessed     SENSATION: Light touch: Impaired Reports L2-S1 being impaired on LLE  MUSCLE LENGTH: Hamstrings: Right 90 deg; Left 90 deg   POSTURE: increased lumbar lordosis  PALPATION: TTP at L5-S1 paraspinals level and upper glutes bilat along with L sided piriformis  LUMBAR ROM:   AROM eval  Flexion 100%*  Extension 100%*  Right lateral flexion   Left lateral flexion   Right rotation 50%*  Left rotation 80%   (Blank rows = not tested)  LOWER EXTREMITY ROM:     Active  Right eval Left eval  Hip flexion    Hip extension    Hip abduction    Hip adduction    Hip internal rotation    Hip external rotation    Knee flexion    Knee extension    Ankle dorsiflexion    Ankle plantarflexion    Ankle inversion    Ankle eversion     (Blank rows = not tested)  LOWER EXTREMITY MMT:    MMT Right eval Left eval  Hip flexion 4 3+  Hip extension    Hip abduction 4 3+  Hip adduction    Hip internal rotation 3 3  Hip external rotation 4 4  Knee flexion 5 4  Knee extension 5 4  Ankle dorsiflexion 5 4  Ankle plantarflexion 5 5  Ankle inversion    Ankle eversion     (Blank rows = not tested)  LUMBAR SPECIAL TESTS:  Straight leg raise test: Negative and Slump test: Negative  FADDIR: positive LLE   FABER: positive LLE  FUNCTIONAL TESTS:  5 times sit to stand: 21.63  sec  GAIT: Distance walked: 10' Assistive device utilized: None Level of assistance: Complete Independence Comments: Normal gait  TREATMENT DATE:   08/18/24: THEREX    Orthostatic vitals:  Sitting BP 144/79 HR 91 SpO2 100%  Standing BP  139/79   Dynamic Gait Index :  -Mild Impairment- Gait Level Surface   -Moderate Impairment- Vertical and Horizontal Head Turns , Stairs   -Severe Impairment- Stepping Over Obstacle   Total Score  14/24   <=19/24 Predictive of falls in the elderly    Head Thrust Test:  Right beating saccade with head thrust to left    Smooth Pursuit: No deficits noted  Finger to Nose Test: No deficits noted  VOR x 1 in standing 2 x 10 at rate of 60 sec  TENS with bichannel setting box method with setting at 4 around L1-L4   50 ft walking x 2    -Pre-TENS  8/10 NRPS  -Post-TENS 6/10 NRPS        08/16/24  Education and review of HEP provided. Discussed importance of f/u with PCP with subacute onset of dizziness and concerns for possible vestibular neuritis after subjective reports of viral infection ~2 months ago.  PATIENT EDUCATION:  Education details: See above Person educated: Patient Education method: Explanation, Demonstration, and Handouts Education comprehension: verbalized understanding, verbal cues required, tactile cues required, and needs further education  HOME EXERCISE PROGRAM: Access Code: [email or web address] URL: [email or web address] Date: 08/16/2024 Prepared by: Dorina Kingfisher  Exercises - Supine Lower Trunk Rotation  - 1 x daily - 7 x weekly - 2 sets - 12 reps - Supine Bridge  - 1 x daily - 7 x weekly - 2 sets - 12 reps - Supine Piriformis Stretch with Foot on Ground  - 1 x daily - 7 x weekly - 1 sets - 3 reps - 30 hold - Sit to Stand with Arms Crossed  - 1 x daily - 3 x weekly - 3 sets - 6 reps - Sidelying Hip  Abduction  - 1 x daily - 3 x weekly - 3 sets - 6 reps  ASSESSMENT:  CLINICAL IMPRESSION: Pt presents for initial treatment after eval for low back pain and unsteadiness. Pt does present at an increased risk for falls with deficits in her dynamic balance that include vestibular dysfunction, stepping over obstacles, and negotiating stairs. Her vestibular dysfunction is also evident from head thrust test yielding a left hypofunction with right beating horizontal saccade. However, it is unclear what is causing her to feel dizzy even without turning head. Orthostatic hypotension ruled out as one of the causes and further testing needed to determine explanation of remaining symptoms. She did respond positively to TENS with decrease in baseline pain by 2 pts even with pain provoking movement like walking. She will continue to benefit from skilled PT services to address these impairments and maximize return to PLOF.     OBJECTIVE IMPAIRMENTS: decreased activity tolerance, decreased balance, decreased mobility, decreased ROM, decreased strength, dizziness, impaired flexibility, impaired sensation, improper body mechanics, postural dysfunction, obesity, and pain.   ACTIVITY LIMITATIONS: carrying, lifting, bending, standing, squatting, sleeping, bed mobility, locomotion level, and caring for others  PARTICIPATION LIMITATIONS: community activity and occupation  PERSONAL FACTORS: Age, Education, Past/current experiences, Profession, Time since onset of injury/illness/exacerbation, and 3+ comorbidities: Anemia, HTN, depression are also affecting patient's functional outcome.   REHAB POTENTIAL: Fair Chronicity of symptoms  CLINICAL DECISION MAKING: Evolving/moderate complexity  EVALUATION COMPLEXITY: Moderate   GOALS: Goals reviewed with patient? No  SHORT TERM GOALS: Target date: 09/13/24  Pt will be independent with HEP to improve Lumbar ROM, LLE strength, and pain to improve ADL  completion Baseline:08/16/24: initial HEP provided Goal status: ONGOING    LONG TERM GOALS: Target date: 10/11/24  Pt will improve mODI by at least 12.8% to demonstrate clinically significant reduction in disability from LBP.  Baseline: 08/16/24: 29/50= 58%  Goal status: ONGOING    2.  Pt will improve 5xSTS to 11.4 sec or less to demonstrate age matched norms for LE strength. Baseline: 08/16/24: 21.63 seconds Goal status: ONGOING    3.  Pt will report worst pain as a 5/10 NPS or less with standing tasks to demonstrate clinically significant reduction in pain Baseline: 08/16/24: 7/10 NPS Goal status: ONGOING    PLAN:  PT FREQUENCY: 1-2x/week  PT DURATION: 8 weeks  PLANNED INTERVENTIONS: [id number]- PT Re-evaluation, 97750- Physical Performance Testing, 97110-Therapeutic exercises, 97530- Therapeutic activity, 97112- Neuromuscular re-education, 97535- Self Care, [id number]- Manual therapy, [phone]- Canalith repositioning, [id number]- Aquatic Therapy, [id number]- Electrical stimulation (unattended), [id number]- Electrical stimulation (manual), 20560 (1-2 muscles), 20561 (3+ muscles)- Dry Needling, Patient/Family education, Balance training, Stair training, Joint mobilization, Spinal mobilization, Vestibular training, Cryotherapy, and Moist  heat.  PLAN FOR NEXT SESSION: Educate patient on how to use TENS using handout from [employer] . Use TENS throughout session especially with LE strengthening and core stability exercises: eccentric chair lean backs and bridging.  Progress VOR exercises and further screen vestibular symptoms.    Toribio Servant PT, DPT  [facility] [address] [place], [address] Phone: [phone]   Fax:  [phone]

## 2024-08-22 ENCOUNTER — Ambulatory Visit: Admitting: Physical Therapy

## 2024-08-24 ENCOUNTER — Ambulatory Visit: Admitting: Physical Therapy

## 2024-08-24 ENCOUNTER — Encounter: Payer: Self-pay | Admitting: Physical Therapy

## 2024-08-24 DIAGNOSIS — M5459 Other low back pain: Secondary | ICD-10-CM

## 2024-08-24 DIAGNOSIS — M6281 Muscle weakness (generalized): Secondary | ICD-10-CM

## 2024-08-24 NOTE — Therapy (Signed)
 OUTPATIENT PHYSICAL THERAPY THORACOLUMBAR TREATMENT   Patient Name: Lacey Ramirez MRN: [id number] DOB:Nov 14, 1960, 63 y.o., female Today's Date: 08/24/2024  END OF SESSION:  PT End of Session - 08/24/24 1432     Visit Number 3    Number of Visits 17    Date for Recertification  10/11/24    Authorization - Visit Number 2    Progress Note Due on Visit 10    PT Start Time 1430    PT Stop Time 1515    PT Time Calculation (min) 45 min    Activity Tolerance Patient tolerated treatment well    Behavior During Therapy WFL for tasks assessed/performed          Past Medical History:  Diagnosis Date   ADHD (attention deficit hyperactivity disorder)    Anemia    Depression    GERD (gastroesophageal reflux disease)    Headache    Hypertension    Past Surgical History:  Procedure Laterality Date   ABDOMINAL HYSTERECTOMY     COLONOSCOPY N/A 04/27/2024   Procedure: COLONOSCOPY;  Surgeon: Therisa Bi, MD;  Location: [facility] ENDOSCOPY;  Service: Gastroenterology;  Laterality: N/A;   COLONOSCOPY WITH PROPOFOL  N/A 11/01/2018   Procedure: COLONOSCOPY WITH PROPOFOL ;  Surgeon: Unk Corinn Skiff, MD;  Location: [facility] ENDOSCOPY;  Service: Gastroenterology;  Laterality: N/A;   ESOPHAGOGASTRODUODENOSCOPY (EGD) WITH PROPOFOL  N/A 11/01/2018   Procedure: ESOPHAGOGASTRODUODENOSCOPY (EGD) WITH PROPOFOL ;  Surgeon: Unk Corinn Skiff, MD;  Location: [facility] ENDOSCOPY;  Service: Gastroenterology;  Laterality: N/A;   POLYPECTOMY  04/27/2024   Procedure: POLYPECTOMY, INTESTINE;  Surgeon: Therisa Bi, MD;  Location: [facility] ENDOSCOPY;  Service: Gastroenterology;;   Patient Active Problem List   Diagnosis Date Noted   OSA (obstructive sleep apnea) 06/05/2023   Obesity 06/05/2023    PCP: Sampson Ethridge LABOR, MD  REFERRING PROVIDER: Sampson Ethridge LABOR, MD  REFERRING DIAG:  [phone] (ICD-10-CM) - Spinal stenosis, lumbar region without neurogenic claudication  M54.16 (ICD-10-CM) - Radiculopathy,  lumbar region    Rationale for Evaluation and Treatment: Rehabilitation  THERAPY DIAG:  Other low back pain  Muscle weakness (generalized)  ONSET DATE: Chronic. Recent flare up in the last week  SUBJECTIVE:                                                                                                                                                                                           SUBJECTIVE STATEMENT: Pt reports that she has a 10/10 low back pain and she has a headache at the start of session. She also reports pain that radiates down her left arm.  PERTINENT HISTORY:  Pt reports B LBP  with L sided radiculopathy. Describes S1 dermatome distribution with N/T to the dorsum of foot. Burning, pins and needles described. Pain similar in her lower back. Has instances of LLE giving away with knee buckling randomly. 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 Pain worsens with standing. Improves with sitting and walking. N/T worsens with prolonged sitting in LLE after 30 minutes. Worst pain 7/10 NPS. Best pain 7/10 NPS. Reports having dizziness and unsteadiness recernyly too. Has onset of seeing stars but also describes dizziness with spinning sensation with head movements. Does report having COVID infection 2 months ago. No dizziness prior to COVID and denies head trauma.   PAIN:  Are you having pain? Yes: NPRS scale: 10/10 NRPS Pain location: L5-S1 along lower back and into LLE S1 dermatome distribution Pain description: pins/needles, burning Aggravating factors: Standing, laying prone Relieving factors: sitting, walking   PRECAUTIONS: None  RED FLAGS: None   WEIGHT BEARING RESTRICTIONS: No  FALLS:  Has patient fallen in last 6 months? 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  ORTHOSTATIC  VITALS: Sitting      DIAGNOSTIC FINDINGS:  N/A  PATIENT SURVEYS:  Modified Oswestry:  MODIFIED OSWESTRY DISABILITY SCALE  Date: 08/16/24 Score  Pain intensity 1 = The pain is bad, but I can manage without having to take pain medication  2. Personal care (washing, dressing, etc.) 1 =  I can take care of myself normally, but it increases my pain.  3. Lifting 4 = I can lift only very light weights  4. Walking 1 = Pain prevents me from walking more than 1 mile.  5. Sitting 4 =  Pain prevents me from sitting more than 10 minutes.  6. Standing 4 =  Pain prevents me from standing more than 10 minutes.  7. Sleeping 2 =  Even when I take pain medication, I sleep less than 6 hours  8. Social Life 4 =  Pain has restricted my social life to my home  9. Traveling 4 = My pain restricts my travel to short necessary journeys under 1/2 hour.  10. Employment/ Homemaking 4 = Pain prevents me from doing even light duties.  Total 29/50   Interpretation of scores: Score Category Description  0-20% Minimal Disability The patient can cope with most living activities. Usually no treatment is indicated apart from advice on lifting, sitting and exercise  21-40% Moderate Disability The patient experiences more pain and difficulty with sitting, lifting and standing. Travel and social life are more difficult and they may be disabled from work. Personal care, sexual activity and sleeping are not grossly affected, and the patient can usually be managed by conservative means  41-60% Severe Disability Pain remains the main problem in this group, but activities of daily living are affected. These patients require a detailed investigation  61-80% Crippled Back pain impinges on all aspects of the patients life. Positive intervention is required  81-100% Bed-bound These patients are either bed-bound or exaggerating their symptoms  Bluford FORBES Zoe DELENA Karon DELENA, et al. Surgery versus conservative management of stable  thoracolumbar fracture: the PRESTO feasibility RCT. [place] ([place]): [employer]; 2021 Nov. [facility] Technology Assessment, No. 25.62.) Appendix 3, Oswestry Disability Index category descriptors. Available from: Findjewelers.cz  Minimally Clinically Important Difference (MCID) = 12.8%  COGNITION: Overall cognitive status: Within  functional limits for tasks assessed     SENSATION: Light touch: Impaired Reports L2-S1 being impaired on LLE  MUSCLE LENGTH: Hamstrings: Right 90 deg; Left 90 deg   POSTURE: increased lumbar lordosis  PALPATION: TTP at L5-S1 paraspinals level and upper glutes bilat along with L sided piriformis  LUMBAR ROM:   AROM eval  Flexion 100%*  Extension 100%*  Right lateral flexion   Left lateral flexion   Right rotation 50%*  Left rotation 80%   (Blank rows = not tested)  LOWER EXTREMITY ROM:     Active  Right eval Left eval  Hip flexion    Hip extension    Hip abduction    Hip adduction    Hip internal rotation    Hip external rotation    Knee flexion    Knee extension    Ankle dorsiflexion    Ankle plantarflexion    Ankle inversion    Ankle eversion     (Blank rows = not tested)  LOWER EXTREMITY MMT:    MMT Right eval Left eval  Hip flexion 4 3+  Hip extension    Hip abduction 4 3+  Hip adduction    Hip internal rotation 3 3  Hip external rotation 4 4  Knee flexion 5 4  Knee extension 5 4  Ankle dorsiflexion 5 4  Ankle plantarflexion 5 5  Ankle inversion    Ankle eversion     (Blank rows = not tested)  LUMBAR SPECIAL TESTS:  Straight leg raise test: Negative and Slump test: Negative  FADDIR: positive LLE   FABER: positive LLE  FUNCTIONAL TESTS:  5 times sit to stand: 21.63 sec  GAIT: Distance walked: 10' Assistive device utilized: None Level of assistance: Complete Independence Comments: Normal gait  TREATMENT DATE:   08/24/24: THEREX   Education about TENS using handout  from [place] [facility] [place]   -Reviewed where to place pads and where not place pads   -What TENS to purchase - two channel  Placement of pads in criss cross box around L1-L4 vertebrae with setting at 4    Lumbar AROM Flexion with [employer], Right, and Left 3 x 30    Sit to Stand 1 x 5    -Pt reports increase in pain while performing    Seated TA with 5 sec hold 1 x 10    -Pt reports feeling dizzy likely from holding her breath   Seated TA with 3  sec hold 1 x 10   -Pt reports that symptoms went away   Standing Hip Extension with forward lean 1 x 10   Standing Hip Extension with forward lean and red TB 1 x 10   -NRPS 6/10   Standing Hip Abduction with forward lean on mat 2 x 10  -NRPS 7/10     PATIENT EDUCATION:  Education details: See above Person educated: Patient Education method: Explanation, Demonstration, and Handouts Education comprehension: verbalized understanding, verbal cues required, tactile cues required, and needs further education  HOME EXERCISE PROGRAM: Access Code: [email or web address] URL: [email or web address] Date: 08/24/2024 Prepared by: Toribio Servant  Exercises - Supine Lower Trunk Rotation  - 1 x daily - 7 x weekly - 2 sets - 12 reps - Supine Piriformis Stretch with Foot on Ground  - 1 x daily - 7 x weekly - 1 sets - 3 reps - 30 hold - Gaze Stability (VOR) x1 Feet Together on Firm Ground With Horizontal Head Turns  - 1 x daily -  7 x weekly - 2 sets - 10 reps - Seated Transversus Abdominis Bracing  - 1 x daily - 7 x weekly - 2 sets - 10 reps - 3 sec  hold - Standing Hip Extension with Resistance at Ankles and Unilateral Counter Support  - 3-4 x weekly - 3 sets - 10 reps - Standing Hip Abduction with Counter Support  - 3-4 x weekly - 2 sets - 10 reps  ASSESSMENT:  CLINICAL IMPRESSION: Pt continues to show improvement with activity tolerance with use of TENS unit with ability to perform standing hip strengthening exercises without being  limited by pain. PT reviewed TENS instructions with patient and provided handout to patient. Pt does show a flexion based directional pattern with increased pain with lumbar extension. PT revised home exercise plan to exclude flexion based exercises like supine bridges and changed strengthening exercises to flexion based patterns. She will continue to benefit from skilled PT services to address these impairments and maximize return to PLOF.    OBJECTIVE IMPAIRMENTS: decreased activity tolerance, decreased balance, decreased mobility, decreased ROM, decreased strength, dizziness, impaired flexibility, impaired sensation, improper body mechanics, postural dysfunction, obesity, and pain.   ACTIVITY LIMITATIONS: carrying, lifting, bending, standing, squatting, sleeping, bed mobility, locomotion level, and caring for others  PARTICIPATION LIMITATIONS: community activity and occupation  PERSONAL FACTORS: Age, Education, Past/current experiences, Profession, Time since onset of injury/illness/exacerbation, and 3+ comorbidities: Anemia, HTN, depression are also affecting patient's functional outcome.   REHAB POTENTIAL: Fair Chronicity of symptoms  CLINICAL DECISION MAKING: Evolving/moderate complexity  EVALUATION COMPLEXITY: Moderate   GOALS: Goals reviewed with patient? No  SHORT TERM GOALS: Target date: 09/13/24  Pt will be independent with HEP to improve Lumbar ROM, LLE strength, and pain to improve ADL completion Baseline:08/16/24: initial HEP provided Goal status: ONGOING    LONG TERM GOALS: Target date: 10/11/24  Pt will improve mODI by at least 12.8% to demonstrate clinically significant reduction in disability from LBP.  Baseline: 08/16/24: 29/50= 58%  Goal status: ONGOING    2.  Pt will improve 5xSTS to 11.4 sec or less to demonstrate age matched norms for LE strength. Baseline: 08/16/24: 21.63 seconds Goal status: ONGOING    3.  Pt will report worst pain as a 5/10 NPS or less with  standing tasks to demonstrate clinically significant reduction in pain Baseline: 08/16/24: 7/10 NPS Goal status: ONGOING    PLAN:  PT FREQUENCY: 1-2x/week  PT DURATION: 8 weeks  PLANNED INTERVENTIONS: [id number]- PT Re-evaluation, 97750- Physical Performance Testing, 97110-Therapeutic exercises, 97530- Therapeutic activity, 97112- Neuromuscular re-education, 97535- Self Care, [id number]- Manual therapy, [phone]- Canalith repositioning, [id number]- Aquatic Therapy, [id number]- Electrical stimulation (unattended), [id number]- Electrical stimulation (manual), 20560 (1-2 muscles), [id number] (3+ muscles)- Dry Needling, Patient/Family education, Balance training, Stair training, Joint mobilization, Spinal mobilization, Vestibular training, Cryotherapy, and Moist heat.  PLAN FOR NEXT SESSION: Progress hip and abdominal strengthening exercises: quadruped TA,  eccentric chair lean backs, mini-squats with forward flexion for glute biase. Progress VOR exercises and further screen vestibular symptoms.    Toribio Servant PT, DPT  [facility] [address], [place], [address] Phone: [phone]   Fax:  [phone]

## 2024-08-29 ENCOUNTER — Ambulatory Visit: Admitting: Physical Therapy

## 2024-08-31 ENCOUNTER — Ambulatory Visit: Admitting: Physical Therapy

## 2024-09-06 ENCOUNTER — Ambulatory Visit: Admitting: Physical Therapy

## 2024-09-13 ENCOUNTER — Ambulatory Visit: Admitting: Physical Therapy

## 2024-09-13 DIAGNOSIS — M5459 Other low back pain: Secondary | ICD-10-CM | POA: Diagnosis not present

## 2024-09-13 DIAGNOSIS — M6281 Muscle weakness (generalized): Secondary | ICD-10-CM

## 2024-09-13 NOTE — Therapy (Signed)
 " OUTPATIENT PHYSICAL THERAPY THORACOLUMBAR TREATMENT   Patient Name: Lacey Ramirez MRN: [id number] DOB:[birth year], 63 y.o., female Today's Date: 09/13/2024  END OF SESSION:  PT End of Session - 09/13/24 1012     Visit Number 4    Number of Visits 17    Date for Recertification  10/11/24    Authorization Type [employer] Medicare Dual    Authorization - Visit Number 4    Progress Note Due on Visit 10    PT Start Time 1015    PT Stop Time 1100    PT Time Calculation (min) 45 min    Activity Tolerance Patient tolerated treatment well    Behavior During Therapy WFL for tasks assessed/performed          Past Medical History:  Diagnosis Date   ADHD (attention deficit hyperactivity disorder)    Anemia    Depression    GERD (gastroesophageal reflux disease)    Headache    Hypertension    Past Surgical History:  Procedure Laterality Date   ABDOMINAL HYSTERECTOMY     COLONOSCOPY N/A 04/27/2024   Procedure: COLONOSCOPY;  Surgeon: Therisa Bi, MD;  Location: [facility] ENDOSCOPY;  Service: Gastroenterology;  Laterality: N/A;   COLONOSCOPY WITH PROPOFOL  N/A 11/01/2018   Procedure: COLONOSCOPY WITH PROPOFOL ;  Surgeon: Unk Corinn Skiff, MD;  Location: [facility] ENDOSCOPY;  Service: Gastroenterology;  Laterality: N/A;   ESOPHAGOGASTRODUODENOSCOPY (EGD) WITH PROPOFOL  N/A 11/01/2018   Procedure: ESOPHAGOGASTRODUODENOSCOPY (EGD) WITH PROPOFOL ;  Surgeon: Unk Corinn Skiff, MD;  Location: [facility] ENDOSCOPY;  Service: Gastroenterology;  Laterality: N/A;   POLYPECTOMY  04/27/2024   Procedure: POLYPECTOMY, INTESTINE;  Surgeon: Therisa Bi, MD;  Location: [facility] ENDOSCOPY;  Service: Gastroenterology;;   Patient Active Problem List   Diagnosis Date Noted   OSA (obstructive sleep apnea) 06/05/2023   Obesity 06/05/2023    PCP: Sampson Ethridge LABOR, MD  REFERRING PROVIDER: Sampson Ethridge LABOR, MD  REFERRING DIAG:  [phone] (ICD-10-CM) - Spinal stenosis, lumbar region without neurogenic claudication   M54.16 (ICD-10-CM) - Radiculopathy, lumbar region    Rationale for Evaluation and Treatment: Rehabilitation  THERAPY DIAG:  Other low back pain  Muscle weakness (generalized)  ONSET DATE: Chronic. Recent flare up in the last week  SUBJECTIVE:                                                                                                                                                                                           SUBJECTIVE STATEMENT: Pt states that she has had a significant decrease in her low back pain and she attributes this to getting more sleep.     PERTINENT HISTORY:  Pt reports B LBP with L sided radiculopathy. Describes S1 dermatome distribution with N/T to the dorsum of foot. Burning, pins and needles described. Pain similar in her lower back. Has instances of LLE giving away with knee buckling randomly. 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 Pain worsens with standing. Improves with sitting and walking. N/T worsens with prolonged sitting in LLE after 30 minutes. Worst pain 7/10 NPS. Best pain 7/10 NPS. Reports having dizziness and unsteadiness recernyly too. Has onset of seeing stars but also describes dizziness with spinning sensation with head movements. Does report having COVID infection 2 months ago. No dizziness prior to COVID and denies head trauma.   PAIN:  Are you having pain? Yes: NPRS scale: 5/10 NRPS Pain location: L5-S1 along lower back and into LLE S1 dermatome distribution Pain description: pins/needles, burning Aggravating factors: Standing, laying prone Relieving factors: sitting, walking   PRECAUTIONS: None  RED FLAGS: None   WEIGHT BEARING RESTRICTIONS: No  FALLS:  Has patient fallen in last 6 months? 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  ORTHOSTATIC VITALS: Sitting      DIAGNOSTIC FINDINGS:  N/A  PATIENT SURVEYS:  Modified Oswestry:  MODIFIED OSWESTRY DISABILITY SCALE  Date: 08/16/24 Score  Pain intensity 1 = The pain is bad, but I can manage without having to take pain medication  2. Personal care (washing, dressing, etc.) 1 =  I can take care of myself normally, but it increases my pain.  3. Lifting 4 = I can lift only very light weights  4. Walking 1 = Pain prevents me from walking more than 1 mile.  5. Sitting 4 =  Pain prevents me from sitting more than 10 minutes.  6. Standing 4 =  Pain prevents me from standing more than 10 minutes.  7. Sleeping 2 =  Even when I take pain medication, I sleep less than 6 hours  8. Social Life 4 =  Pain has restricted my social life to my home  9. Traveling 4 = My pain restricts my travel to short necessary journeys under 1/2 hour.  10. Employment/ Homemaking 4 = Pain prevents me from doing even light duties.  Total 29/50   Interpretation of scores: Score Category Description  0-20% Minimal Disability The patient can cope with most living activities. Usually no treatment is indicated apart from advice on lifting, sitting and exercise  21-40% Moderate Disability The patient experiences more pain and difficulty with sitting, lifting and standing. Travel and social life are more difficult and they may be disabled from work. Personal care, sexual activity and sleeping are not grossly affected, and the patient can usually be managed by conservative means  41-60% Severe Disability Pain remains the main problem in this group, but activities of daily living are affected. These patients require a detailed investigation  61-80% Crippled Back pain impinges on all aspects of the patients life. Positive intervention is required  81-100% Bed-bound These patients are either bed-bound or exaggerating their symptoms  Bluford FORBES Zoe DELENA Karon DELENA, et al. Surgery versus conservative  management of stable thoracolumbar fracture: the PRESTO feasibility RCT. [place] ([place]): [employer]; 2021 Nov. [facility] Technology Assessment, No. 25.62.) Appendix 3, Oswestry Disability Index category descriptors. Available from: Findjewelers.cz  Minimally Clinically Important Difference (MCID) = 12.8%  COGNITION:  Overall cognitive status: Within functional limits for tasks assessed     SENSATION: Light touch: Impaired Reports L2-S1 being impaired on LLE  MUSCLE LENGTH: Hamstrings: Right 90 deg; Left 90 deg   POSTURE: increased lumbar lordosis  PALPATION: TTP at L5-S1 paraspinals level and upper glutes bilat along with L sided piriformis  LUMBAR ROM:   AROM eval  Flexion 100%*  Extension 100%*  Right lateral flexion   Left lateral flexion   Right rotation 50%*  Left rotation 80%   (Blank rows = not tested)  LOWER EXTREMITY ROM:     Active  Right eval Left eval  Hip flexion    Hip extension    Hip abduction    Hip adduction    Hip internal rotation    Hip external rotation    Knee flexion    Knee extension    Ankle dorsiflexion    Ankle plantarflexion    Ankle inversion    Ankle eversion     (Blank rows = not tested)  LOWER EXTREMITY MMT:    MMT Right eval Left eval  Hip flexion 4 3+  Hip extension    Hip abduction 4 3+  Hip adduction    Hip internal rotation 3 3  Hip external rotation 4 4  Knee flexion 5 4  Knee extension 5 4  Ankle dorsiflexion 5 4  Ankle plantarflexion 5 5  Ankle inversion    Ankle eversion     (Blank rows = not tested)  LUMBAR SPECIAL TESTS:  Straight leg raise test: Negative and Slump test: Negative  FADDIR: positive LLE   FABER: positive LLE  FUNCTIONAL TESTS:  5 times sit to stand: 21.63 sec  GAIT: Distance walked: 10' Assistive device utilized: None Level of assistance: Complete Independence Comments: Normal gait  TREATMENT DATE:   09/13/24:  JERRE  Lumbar AAROM  Ball Roll Silver Flex Center, Center Right, Center Left 3 x 10   Modified Standing Hip Extension with red band 2 x 10   -min VC to maintain knee extension during exercise   Modified Standing Hip Extension with green band 1 x 10   Modified Standing Hip Abduction non-alternating  2 x 10  -min VC to stay upright and keep toes pointer forward   -pt needed to stop exercise due to increased LLE radicular pain   Seated Sciatic Nerve Glide on RLE 1 x 10   -mod VC for sequencing dorsiflexion with cervical flexion  Seated Sciatic Nerve Glide on LLE 1 x 10    Seated HS Stretch with strap on 12 inch stool 4 x 60 sec  Right Side Lying Left Hip Abduction with 45 deg knee flexion 1 x 10   -Pt reports increased left side radicular   Right Side Lying Left Clam Shell with green band 1 x 10  Right Side Lying Left Clam Shell with blue band 1 x 10       PATIENT EDUCATION:  Education details: See above Person educated: Patient Education method: Explanation, Demonstration, and Handouts Education comprehension: verbalized understanding, verbal cues required, tactile cues required, and needs further education  HOME EXERCISE PROGRAM: Access Code: [email or web address] URL: [email or web address] Date: 09/13/2024 Prepared by: Toribio Servant  Exercises - Supine Lower Trunk Rotation  - 1 x daily - 7 x weekly - 2 sets - 12 reps - Seated Hamstring Stretch with Strap  - 1 x daily - 7 x weekly - 3 reps - 60 sec  hold - Seated Slump Neural Tensioner (Mirrored)  -  1 x daily - 7 x weekly - 2 sets - 10 reps - Supine Piriformis Stretch with Foot on Ground  - 1 x daily - 7 x weekly - 1 sets - 3 reps - 30 hold - Gaze Stability (VOR) x1 Feet Together on Firm Ground With Horizontal Head Turns  - 1 x daily - 7 x weekly - 2 sets - 10 reps - Seated Transversus Abdominis Bracing  - 1 x daily - 7 x weekly - 2 sets - 10 reps - 3 sec  hold - Standing Hip Extension with Resistance at Ankles and Unilateral Counter Support  - 3-4  x weekly - 3 sets - 10 reps - Clamshell with Resistance (Mirrored)  - 3-4 x weekly - 3 sets - 8-10 reps  ASSESSMENT:  CLINICAL IMPRESSION: Pt limited by left sided radicular pain during session especially with standing hip abduction. She was able to perform hip abductor strengthening with modification to non-weight bearing position. Otherwise, pt shows an overall decrease in low back pain and improved activity tolerance with ability to perform progressed hip strengthening exercises. She will continue to benefit from skilled PT services to address these impairments and maximize return to PLOF.    OBJECTIVE IMPAIRMENTS: decreased activity tolerance, decreased balance, decreased mobility, decreased ROM, decreased strength, dizziness, impaired flexibility, impaired sensation, improper body mechanics, postural dysfunction, obesity, and pain.   ACTIVITY LIMITATIONS: carrying, lifting, bending, standing, squatting, sleeping, bed mobility, locomotion level, and caring for others  PARTICIPATION LIMITATIONS: community activity and occupation  PERSONAL FACTORS: Age, Education, Past/current experiences, Profession, Time since onset of injury/illness/exacerbation, and 3+ comorbidities: Anemia, HTN, depression are also affecting patient's functional outcome.   REHAB POTENTIAL: Fair Chronicity of symptoms  CLINICAL DECISION MAKING: Evolving/moderate complexity  EVALUATION COMPLEXITY: Moderate   GOALS: Goals reviewed with patient? No  SHORT TERM GOALS: Target date: 09/13/24  Pt will be independent with HEP to improve Lumbar ROM, LLE strength, and pain to improve ADL completion Baseline:08/16/24: initial HEP provided 09/13/24 Able to perform independently  Goal status: ACHIEVED    LONG TERM GOALS: Target date: 10/11/24  Pt will improve mODI by at least 12.8% to demonstrate clinically significant reduction in disability from LBP.  Baseline: 08/16/24: 29/50= 58%  Goal status: ONGOING    2.  Pt will  improve 5xSTS to 11.4 sec or less to demonstrate age matched norms for LE strength. Baseline: 08/16/24: 21.63 seconds Goal status: ONGOING    3.  Pt will report worst pain as a 5/10 NPS or less with standing tasks to demonstrate clinically significant reduction in pain Baseline: 08/16/24: 7/10 NPS Goal status: ONGOING    PLAN:  PT FREQUENCY: 1-2x/week  PT DURATION: 8 weeks  PLANNED INTERVENTIONS: [id number]- PT Re-evaluation, 97750- Physical Performance Testing, 97110-Therapeutic exercises, 97530- Therapeutic activity, 97112- Neuromuscular re-education, 97535- Self Care, [id number]- Manual therapy, [phone]- Canalith repositioning, [id number]- Aquatic Therapy, [id number]- Electrical stimulation (unattended), [id number]- Electrical stimulation (manual), 20560 (1-2 muscles), 20561 (3+ muscles)- Dry Needling, Patient/Family education, Balance training, Stair training, Joint mobilization, Spinal mobilization, Vestibular training, Cryotherapy, and Moist heat.  PLAN FOR NEXT SESSION: Reassess long term goals.  Progress hip and abdominal strengthening exercises: quadruped TA,  eccentric chair lean backs, mini-squats with forward flexion for glute biase. Check in on dizziness and progress VOR exercises and further screen vestibular symptoms.    Toribio Servant PT, DPT  [facility] [address] [place], [address] Phone: [phone]   Fax:  [phone]  "
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
## 2024-09-19 ENCOUNTER — Ambulatory Visit

## 2024-09-21 ENCOUNTER — Ambulatory Visit

## 2024-09-22 ENCOUNTER — Ambulatory Visit: Admitting: Psychiatry

## 2024-09-27 ENCOUNTER — Ambulatory Visit: Attending: Internal Medicine

## 2024-09-29 ENCOUNTER — Ambulatory Visit

## 2024-10-06 ENCOUNTER — Ambulatory Visit: Admitting: Internal Medicine

## 2024-10-06 ENCOUNTER — Encounter: Payer: Self-pay | Admitting: Internal Medicine

## 2024-10-06 VITALS — BP 138/80 | HR 103 | Temp 98.5°F | Wt 195.4 lb

## 2024-10-06 DIAGNOSIS — J209 Acute bronchitis, unspecified: Secondary | ICD-10-CM

## 2024-10-06 DIAGNOSIS — G4733 Obstructive sleep apnea (adult) (pediatric): Secondary | ICD-10-CM

## 2024-10-06 DIAGNOSIS — R062 Wheezing: Secondary | ICD-10-CM

## 2024-10-06 DIAGNOSIS — J45901 Unspecified asthma with (acute) exacerbation: Secondary | ICD-10-CM

## 2024-10-06 MED ORDER — PREDNISONE 20 MG PO TABS: 40.0000 mg | ORAL_TABLET | Freq: Every day | ORAL | 0 refills | Status: DC

## 2024-10-06 MED ORDER — AZITHROMYCIN 250 MG PO TABS: ORAL_TABLET | ORAL | 0 refills | Status: AC

## 2024-10-06 MED ORDER — PREDNISONE 20 MG PO TABS: 20.0000 mg | ORAL_TABLET | Freq: Every day | ORAL | 1 refills | Status: AC

## 2024-10-06 NOTE — Patient Instructions (Addendum)
" ° ° ° ° °  Recommend home sleep test to assess for sleep apnea* Z-Pak Start prednisone  therapy  "

## 2024-10-06 NOTE — Progress Notes (Signed)
 "  Name: Lacey Ramirez MRN: [id number] DOB: 05/02/1961     64 year old female seen for sleep consult December 03, 2022 for snoring and daytime sleepiness found to have mild obstructive sleep apnea  TEST/EVENTS :  Home sleep study done on February 18, 2023 showed mild sleep apnea with AHI at 8.8/hour and SpO2 low at 87%   CHIEF COMPLAINT:  Follow-up assessment for sleep apnea   HISTORY OF PRESENT ILLNESS: Follow-up assessment for sleep apnea Patient has been noncompliant unable to tolerate CPAP therapy Patient has a history of mild sleep apnea AHI 8.82 years ago will plan to repeat home sleep test  Patient having signs symptoms of infection upper respiratory tract Fevers productive cough bilateral wheezing Plan to start antibiotics and prednisone   PAST MEDICAL HISTORY :   has a past medical history of ADHD (attention deficit hyperactivity disorder), Anemia, Depression, GERD (gastroesophageal reflux disease), Headache, and Hypertension.  has a past surgical history that includes Esophagogastroduodenoscopy (egd) with propofol  (N/A, 11/01/2018); Colonoscopy with propofol  (N/A, 11/01/2018); Abdominal hysterectomy; Colonoscopy (N/A, 04/27/2024); and Polypectomy (04/27/2024). Prior to Admission medications   Medication Sig Start Date End Date Taking? Authorizing Provider  allopurinol (ZYLOPRIM) 300 MG tablet Take 300 mg by mouth daily.    [provider]  baclofen (LIORESAL) 10 MG tablet Take 10 mg by mouth daily as needed.    [provider]  buPROPion  (WELLBUTRIN  XL) 300 MG 24 hr tablet Take 1 tablet (300 mg total) by mouth daily. 11/11/22 01/10/23  Hisada, Reina, MD  Cetirizine HCl 10 MG CAPS Take 10 mg by mouth daily.    [provider]  cyclobenzaprine  (FLEXERIL ) 10 MG tablet Take 1 tablet (10 mg total) by mouth 3 (three) times daily as needed for muscle spasms. Patient taking differently: Take 10 mg by mouth at bedtime as needed for muscle spasms. 05/23/17   Jacquetta Stabs, MD  dicyclomine  (BENTYL ) 20 MG tablet Take 1 tablet (20 mg total) by mouth 4 (four) times daily -  before meals and at bedtime for 5 days. 08/27/20 09/01/20  Angelena Smalls, MD  FLUoxetine  (PROZAC ) 40 MG capsule Take 1 capsule (40 mg total) by mouth daily. 11/22/22 01/21/23  Vickey Mettle, MD  gabapentin (NEURONTIN) 600 MG tablet Take 600 mg by mouth 3 (three) times daily.    [provider]  hydrochlorothiazide (MICROZIDE) 12.5 MG capsule Take 12.5 mg by mouth daily.    [provider]  hydrOXYzine  (ATARAX /VISTARIL ) 25 MG tablet Take 25 mg by mouth daily as needed.    [provider]  liver oil-zinc oxide (DESITIN) 40 % ointment Apply 1 Application topically as needed for irritation.    [provider]  omeprazole  (PRILOSEC) 40 MG capsule Take 1 capsule (40 mg total) by mouth 2 (two) times daily before a meal for 30 days. 10/12/18 11/11/18  Unk Corinn Skiff, MD  sucralfate  (CARAFATE ) 1 g tablet Take 1 tablet (1 g total) by mouth 4 (four) times daily for 5 days. 08/27/20 09/01/20  Angelena Smalls, MD   No Known Allergies  FAMILY HISTORY:  family history includes Bipolar disorder in her maternal uncle; Breast cancer in her maternal grandmother, paternal aunt, and paternal grandmother; Schizophrenia in her cousin. SOCIAL HISTORY:  reports that she has never smoked. She has never used smokeless tobacco. She reports that she does not currently use alcohol. She reports that she does not use drugs.    Physical Examination:  General Appearance: No distress  EYES EOM intact.   NECK  Supple, No JVD Pulmonary: normal breath sounds, + wheezing.  CardiovascularNormal S1,S2.  No m/r/g.   Ext pulses intact, cap refill intact  ALL OTHER ROS ARE NEGATIVE   ASSESSMENT AND PLAN SYNOPSIS   64 year old female seen for sleep consult December 03, 2022 for snoring and daytime sleepiness found to have mild obstructive sleep apnea AHI 8, plan for retesting with repeat  Home sleep test  Patient with acute Bronchitis will treat with Z pak and prednisone  Use albuterol as needed  Patient no longer wants to use CPAP, intolerant of mask   MEDICATION ADJUSTMENTS/LABS AND TESTS ORDERED: Z pak and prednisone  Use albuterol as needed   CURRENT MEDICATIONS REVIEWED AT LENGTH WITH PATIENT TODAY   Patient  satisfied with Plan of action and management. All questions answered   Follow up 3 months   I spent a total of 41 minutes dedicated to the care of this patient on the date of this encounter to include pre-visit review of records, face-to-face time with the patient discussing conditions above, post visit ordering of testing, clinical documentation with the electronic health record, making appropriate referrals as documented, and communicating necessary information to the patient's healthcare team.    The Patient requires high complexity decision making for assessment and support, frequent evaluation and titration of therapies, application of advanced monitoring technologies and extensive interpretation of multiple databases.  Patient satisfied with Plan of action and management. All questions answered    Nickolas Alm Cellar, M.D.  [facility]  Medical Director [facility] [place]     "

## 2024-11-01 ENCOUNTER — Ambulatory Visit: Admitting: Psychiatry

## 2025-01-04 ENCOUNTER — Ambulatory Visit: Admitting: Internal Medicine
